# Patient Record
Sex: Female | Born: 1975 | Race: White | Hispanic: No | Marital: Married | Smoking: Former smoker
Health system: Southern US, Community
[De-identification: ages and names within clinical notes are randomized; demographics above are authoritative.]

## PROBLEM LIST (undated history)

## (undated) DIAGNOSIS — R51 Headache: Secondary | ICD-10-CM

## (undated) DIAGNOSIS — M069 Rheumatoid arthritis, unspecified: Secondary | ICD-10-CM

## (undated) DIAGNOSIS — R519 Headache, unspecified: Secondary | ICD-10-CM

## (undated) DIAGNOSIS — F419 Anxiety disorder, unspecified: Secondary | ICD-10-CM

## (undated) DIAGNOSIS — K219 Gastro-esophageal reflux disease without esophagitis: Secondary | ICD-10-CM

## (undated) DIAGNOSIS — I499 Cardiac arrhythmia, unspecified: Secondary | ICD-10-CM

## (undated) DIAGNOSIS — J302 Other seasonal allergic rhinitis: Secondary | ICD-10-CM

## (undated) DIAGNOSIS — R011 Cardiac murmur, unspecified: Secondary | ICD-10-CM

---
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
## 2006-01-10 HISTORY — PX: TUBAL LIGATION: SHX77

## 2008-01-11 HISTORY — PX: LEEP: SHX91

## 2012-10-16 ENCOUNTER — Ambulatory Visit: Payer: Self-pay | Admitting: Family Medicine

## 2012-10-19 DIAGNOSIS — E010 Iodine-deficiency related diffuse (endemic) goiter: Secondary | ICD-10-CM | POA: Insufficient documentation

## 2013-01-29 ENCOUNTER — Ambulatory Visit: Payer: Self-pay | Admitting: Emergency Medicine

## 2014-03-19 ENCOUNTER — Ambulatory Visit: Payer: Self-pay | Admitting: Internal Medicine

## 2014-03-19 LAB — HM MAMMOGRAPHY

## 2014-03-24 ENCOUNTER — Ambulatory Visit: Payer: Self-pay | Admitting: Internal Medicine

## 2014-04-17 LAB — LIPID PANEL
Cholesterol: 208 mg/dL — AB (ref 0–200)
HDL: 55 mg/dL (ref 35–70)
LDL Cholesterol: 137 mg/dL
Triglycerides: 79 mg/dL (ref 40–160)

## 2014-04-17 LAB — HM PAP SMEAR: HM PAP: NEGATIVE

## 2014-04-17 LAB — TSH: TSH: 1.2 u[IU]/mL (ref <=5.90)

## 2014-05-19 ENCOUNTER — Encounter: Payer: Self-pay | Admitting: *Deleted

## 2014-05-20 NOTE — Discharge Instructions (Signed)

## 2014-05-22 ENCOUNTER — Ambulatory Visit: Payer: 59 | Admitting: Student in an Organized Health Care Education/Training Program

## 2014-05-22 ENCOUNTER — Other Ambulatory Visit: Payer: Self-pay | Admitting: Gastroenterology

## 2014-05-22 ENCOUNTER — Ambulatory Visit
Admission: RE | Admit: 2014-05-22 | Discharge: 2014-05-22 | Disposition: A | Discharge status: Home / Self Care | Payer: 59 | Source: Ambulatory Visit | Attending: Gastroenterology | Admitting: Gastroenterology

## 2014-05-22 ENCOUNTER — Encounter
Admission: RE | Disposition: A | Discharge status: Home / Self Care | Payer: Self-pay | Source: Ambulatory Visit | Attending: Gastroenterology

## 2014-05-22 DIAGNOSIS — K219 Gastro-esophageal reflux disease without esophagitis: Secondary | ICD-10-CM | POA: Insufficient documentation

## 2014-05-22 DIAGNOSIS — Z9889 Other specified postprocedural states: Secondary | ICD-10-CM | POA: Insufficient documentation

## 2014-05-22 DIAGNOSIS — K5900 Constipation, unspecified: Secondary | ICD-10-CM | POA: Insufficient documentation

## 2014-05-22 DIAGNOSIS — K2950 Unspecified chronic gastritis without bleeding: Secondary | ICD-10-CM | POA: Diagnosis not present

## 2014-05-22 DIAGNOSIS — D124 Benign neoplasm of descending colon: Secondary | ICD-10-CM | POA: Insufficient documentation

## 2014-05-22 DIAGNOSIS — Z87891 Personal history of nicotine dependence: Secondary | ICD-10-CM | POA: Insufficient documentation

## 2014-05-22 DIAGNOSIS — R1310 Dysphagia, unspecified: Secondary | ICD-10-CM | POA: Diagnosis present

## 2014-05-22 DIAGNOSIS — J302 Other seasonal allergic rhinitis: Secondary | ICD-10-CM | POA: Diagnosis not present

## 2014-05-22 DIAGNOSIS — Z791 Long term (current) use of non-steroidal anti-inflammatories (NSAID): Secondary | ICD-10-CM | POA: Insufficient documentation

## 2014-05-22 DIAGNOSIS — K5909 Other constipation: Secondary | ICD-10-CM | POA: Diagnosis present

## 2014-05-22 DIAGNOSIS — Z7951 Long term (current) use of inhaled steroids: Secondary | ICD-10-CM | POA: Diagnosis not present

## 2014-05-22 DIAGNOSIS — K640 First degree hemorrhoids: Secondary | ICD-10-CM | POA: Diagnosis not present

## 2014-05-22 DIAGNOSIS — Z7982 Long term (current) use of aspirin: Secondary | ICD-10-CM | POA: Diagnosis not present

## 2014-05-22 DIAGNOSIS — D125 Benign neoplasm of sigmoid colon: Secondary | ICD-10-CM | POA: Insufficient documentation

## 2014-05-22 DIAGNOSIS — F419 Anxiety disorder, unspecified: Secondary | ICD-10-CM | POA: Diagnosis not present

## 2014-05-22 DIAGNOSIS — K921 Melena: Secondary | ICD-10-CM | POA: Diagnosis present

## 2014-05-22 HISTORY — PX: COLONOSCOPY: SHX5424

## 2014-05-22 HISTORY — DX: Gastro-esophageal reflux disease without esophagitis: K21.9

## 2014-05-22 HISTORY — PX: ESOPHAGOGASTRODUODENOSCOPY: SHX1529

## 2014-05-22 HISTORY — DX: Cardiac arrhythmia, unspecified: I49.9

## 2014-05-22 HISTORY — DX: Cardiac murmur, unspecified: R01.1

## 2014-05-22 HISTORY — DX: Anxiety disorder, unspecified: F41.9

## 2014-05-22 HISTORY — DX: Headache, unspecified: R51.9

## 2014-05-22 HISTORY — DX: Headache: R51

## 2014-05-22 HISTORY — PX: ESOPHAGOGASTRODUODENOSCOPY: SHX5428

## 2014-05-22 HISTORY — DX: Other seasonal allergic rhinitis: J30.2

## 2014-05-22 SURGERY — COLONOSCOPY
Anesthesia: Monitor Anesthesia Care | Wound class: Contaminated

## 2014-05-22 MED ORDER — LACTATED RINGERS IV SOLN
INTRAVENOUS | Status: DC
  Administered 2014-05-22 (×2): via INTRAVENOUS

## 2014-05-22 MED ORDER — SODIUM CHLORIDE 0.9 % IV SOLN: INTRAVENOUS | Status: DC

## 2014-05-22 MED ORDER — SIMETHICONE 40 MG/0.6ML PO SUSP
ORAL | Status: DC | PRN
  Administered 2014-05-22: 09:00:00

## 2014-05-22 MED ORDER — PROPOFOL 10 MG/ML IV BOLUS
INTRAVENOUS | Status: DC | PRN
  Administered 2014-05-22: 20 mg via INTRAVENOUS
  Administered 2014-05-22: 30 mg via INTRAVENOUS
  Administered 2014-05-22 (×2): 20 mg via INTRAVENOUS
  Administered 2014-05-22 (×3): 30 mg via INTRAVENOUS
  Administered 2014-05-22 (×3): 20 mg via INTRAVENOUS

## 2014-05-22 MED ORDER — LIDOCAINE HCL (CARDIAC) 20 MG/ML IV SOLN
INTRAVENOUS | Status: DC | PRN
  Administered 2014-05-22: 30 mg via INTRAVENOUS

## 2014-05-22 SURGICAL SUPPLY — 38 items
BALLN DILATOR 10-12 8 (BALLOONS)
BALLN DILATOR 12-15 8 (BALLOONS)
BALLN DILATOR 15-18 8 (BALLOONS)
BALLN DILATOR CRE 0-12 8 (BALLOONS)
BALLN DILATOR ESOPH 8 10 CRE (MISCELLANEOUS) IMPLANT
BALLOON DILATOR 12-15 8 (BALLOONS) IMPLANT
BALLOON DILATOR 15-18 8 (BALLOONS) IMPLANT
BALLOON DILATOR CRE 0-12 8 (BALLOONS) IMPLANT
BLOCK BITE 60FR ADLT L/F GRN (MISCELLANEOUS) ×2 IMPLANT
CANISTER SUCT 1200ML W/VALVE (MISCELLANEOUS) ×2 IMPLANT
FCP ESCP3.2XJMB 240X2.8X (MISCELLANEOUS)
FORCEPS BIOP RAD 4 LRG CAP 4 (CUTTING FORCEPS) ×2 IMPLANT
FORCEPS BIOP RJ4 240 W/NDL (MISCELLANEOUS)
FORCEPS ESCP3.2XJMB 240X2.8X (MISCELLANEOUS) IMPLANT
GOWN CVR UNV OPN BCK APRN NK (MISCELLANEOUS) ×2 IMPLANT
GOWN ISOL THUMB LOOP REG UNIV (MISCELLANEOUS) ×2
HEMOCLIP INSTINCT (CLIP) IMPLANT
INJECTOR VARIJECT VIN23 (MISCELLANEOUS) IMPLANT
KIT CO2 TUBING (TUBING) ×2 IMPLANT
KIT DEFENDO VALVE AND CONN (KITS) IMPLANT
KIT ENDO PROCEDURE OLY (KITS) ×2 IMPLANT
LIGATOR MULTIBAND 6SHOOTER MBL (MISCELLANEOUS) IMPLANT
MARKER SPOT ENDO TATTOO 5ML (MISCELLANEOUS) IMPLANT
PAD GROUND ADULT SPLIT (MISCELLANEOUS) IMPLANT
SNARE SHORT THROW 13M SML OVAL (MISCELLANEOUS) ×2 IMPLANT
SNARE SHORT THROW 30M LRG OVAL (MISCELLANEOUS) IMPLANT
SPOT EX ENDOSCOPIC TATTOO (MISCELLANEOUS)
SUCTION POLY TRAP 4CHAMBER (MISCELLANEOUS) IMPLANT
SYR INFLATION 60ML (SYRINGE) IMPLANT
TRAP SUCTION POLY (MISCELLANEOUS) ×2 IMPLANT
TUBING CONN 6MMX3.1M (TUBING)
TUBING SUCTION CONN 0.25 STRL (TUBING) IMPLANT
UNDERPAD 30X60 958B10 (PK) (MISCELLANEOUS) IMPLANT
VALVE BIOPSY ENDO (VALVE) IMPLANT
VARIJECT INJECTOR VIN23 (MISCELLANEOUS)
WATER AUXILLARY (MISCELLANEOUS) IMPLANT
WATER STERILE IRR 500ML POUR (IV SOLUTION) ×2 IMPLANT
WIRE CRE 18-20MM 8CM F G (MISCELLANEOUS) IMPLANT

## 2014-05-22 NOTE — Anesthesia Postprocedure Evaluation (Signed)
  Anesthesia Post-op Note  Patient: Debra Dixon  Procedure(s) Performed: Procedure(s) with comments: COLONOSCOPY (N/A) - cecum time- 0925  ESOPHAGOGASTRODUODENOSCOPY (EGD) (N/A)  Anesthesia type:MAC  Patient location: PACU  Post pain: Pain level controlled  Post assessment: Post-op Vital signs reviewed, Patient's Cardiovascular Status Stable, Respiratory Function Stable, Patent Airway and No signs of Nausea or vomiting  Post vital signs: Reviewed and stable  Last Vitals:  Filed Vitals:   05/22/14 0945  BP: 100/42  Pulse: 65  Temp:   Resp:     Level of consciousness: awake, alert  and patient cooperative  Complications: No apparent anesthesia complications

## 2014-05-22 NOTE — Anesthesia Preprocedure Evaluation (Signed)
Anesthesia Evaluation  Patient identified by MRN, date of birth, ID band  Reviewed: Allergy & Precautions, H&P , NPO status , Patient's Chart, lab work & pertinent test results  History of Anesthesia Complications Negative for: history of anesthetic complications  Airway Mallampati: II  TM Distance: >3 FB Neck ROM: full    Dental no notable dental hx. (+)    Pulmonary former smoker (3 weeks ago),    Pulmonary exam normal       Cardiovascular Exercise Tolerance: Good + dysrhythmias (palpitation) Rhythm:regular Rate:Normal     Neuro/Psych  Headaches,    GI/Hepatic GERD-  Controlled,  Endo/Other    Renal/GU      Musculoskeletal   Abdominal   Peds  Hematology   Anesthesia Other Findings   Reproductive/Obstetrics negative OB ROS                             Anesthesia Physical Anesthesia Plan  ASA: II  Anesthesia Plan: MAC   Post-op Pain Management:    Induction:   Airway Management Planned:   Additional Equipment:   Intra-op Plan:   Post-operative Plan:   Informed Consent: I have reviewed the patients History and Physical, chart, labs and discussed the procedure including the risks, benefits and alternatives for the proposed anesthesia with the patient or authorized representative who has indicated his/her understanding and acceptance.     Plan Discussed with: CRNA  Anesthesia Plan Comments:         Anesthesia Quick Evaluation

## 2014-05-22 NOTE — Op Note (Signed)
[facility] Gastroenterology Patient Name: Debra Dixon Procedure Date: 05/22/2014 8:56 AM MRN: [id number] Account #: [id number] Date of Birth: 03-30-1975 Admit Type: Outpatient Age: 39 Room: [facility] OR ROOM 01 Gender: Female Note Status: Finalized Procedure:         Upper GI endoscopy Indications:       Dysphagia Providers:         Lucilla Lame, MD Referring MD:      Halina Maidens, MD (Referring MD) Medicines:         Propofol per Anesthesia Complications:     No immediate complications. Procedure:         Pre-Anesthesia Assessment:                    - Prior to the procedure, a History and Physical was                     performed, and patient medications and allergies were                     reviewed. The patient's tolerance of previous anesthesia                     was also reviewed. The risks and benefits of the procedure                     and the sedation options and risks were discussed with the                     patient. All questions were answered, and informed consent                     was obtained. Prior Anticoagulants: The patient has taken                     no previous anticoagulant or antiplatelet agents. ASA                     Grade Assessment: II - A patient with mild systemic                     disease. After reviewing the risks and benefits, the                     patient was deemed in satisfactory condition to undergo                     the procedure.                    After obtaining informed consent, the endoscope was passed                     under direct vision. Throughout the procedure, the                     patient's blood pressure, pulse, and oxygen saturations                     were monitored continuously. The Olympus GIF H180J                     colonscope (Z#:[id number]) was introduced through the mouth,  and advanced to the second part of duodenum. The upper GI                     endoscopy was  accomplished without difficulty. The patient                     tolerated the procedure well. Findings:      The examined esophagus was normal. Two random biopsies were obtained in       the middle third of the esophagus with cold forceps for histology.      Localized mild inflammation characterized by erythema was found in the       gastric antrum. Biopsies were taken with a cold forceps for histology.      The examined duodenum was normal. Impression:        - Normal esophagus.                    - Gastritis. Biopsied.                    - Normal examined duodenum.                    - Two random biopsies were obtained in the middle third of                     the esophagus. Recommendation:    - Await pathology results.                    - Perform a colonoscopy today. Procedure Code(s): --- Professional ---                    [phone], Esophagogastroduodenoscopy, flexible, transoral;                     with biopsy, single or multiple Diagnosis Code(s): --- Professional ---                    R13.10, Dysphagia, unspecified                    K29.70, Gastritis, unspecified, without bleeding CPT copyright 2014 American Medical Association. All rights reserved. The codes documented in this report are preliminary and upon coder review may  be revised to meet current compliance requirements. Lucilla Lame, MD 05/22/2014 9:20:18 AM This report has been signed electronically. Number of Addenda: 0 Note Initiated On: 05/22/2014 8:56 AM Total Procedure Duration: 0 hours 2 minutes 2 seconds       [facility]

## 2014-05-22 NOTE — Transfer of Care (Signed)
Immediate Anesthesia Transfer of Care Note  Patient: Debra Dixon  Procedure(s) Performed: Procedure(s) with comments: COLONOSCOPY (N/A) - cecum time- 0925  ESOPHAGOGASTRODUODENOSCOPY (EGD) (N/A)  Patient Location: PACU  Anesthesia Type: MAC  Level of Consciousness: awake, alert  and patient cooperative  Airway and Oxygen Therapy: Patient Spontanous Breathing and Patient connected to supplemental oxygen  Post-op Assessment: Post-op Vital signs reviewed, Patient's Cardiovascular Status Stable, Respiratory Function Stable, Patent Airway and No signs of Nausea or vomiting  Post-op Vital Signs: Reviewed and stable  Complications: No apparent anesthesia complications

## 2014-05-22 NOTE — Op Note (Signed)
[facility] Gastroenterology Patient Name: Debra Dixon Procedure Date: 05/22/2014 8:57 AM MRN: [id number] Account #: [id number] Date of Birth: [birth year] Admit Type: Outpatient Age: 39 Room: [facility] OR ROOM 01 Gender: Female Note Status: Finalized Procedure:         Colonoscopy Indications:       Hematochezia, Constipation Providers:         Lucilla Lame, MD Referring MD:      Halina Maidens, MD (Referring MD) Medicines:         Propofol per Anesthesia Complications:     No immediate complications. Procedure:         Pre-Anesthesia Assessment:                    - Prior to the procedure, a History and Physical was                     performed, and patient medications and allergies were                     reviewed. The patient's tolerance of previous anesthesia                     was also reviewed. The risks and benefits of the procedure                     and the sedation options and risks were discussed with the                     patient. All questions were answered, and informed consent                     was obtained. Prior Anticoagulants: The patient has taken                     no previous anticoagulant or antiplatelet agents. ASA                     Grade Assessment: II - A patient with mild systemic                     disease. After reviewing the risks and benefits, the                     patient was deemed in satisfactory condition to undergo                     the procedure.                    After obtaining informed consent, the colonoscope was                     passed under direct vision. Throughout the procedure, the                     patient's blood pressure, pulse, and oxygen saturations                     were monitored continuously. The Olympus CF H180AL                     colonoscope (S#: [id number]) was introduced through the anus  and advanced to the the cecum, identified by appendiceal                     orifice  and ileocecal valve. The colonoscopy was performed                     without difficulty. The patient tolerated the procedure                     well. The quality of the bowel preparation was excellent. Findings:      The perianal and digital rectal examinations were normal.      A 5 mm polyp was found in the descending colon. The polyp was sessile.       The polyp was removed with a cold snare. Resection and retrieval were       complete.      A 5 mm polyp was found in the sigmoid colon. The polyp was sessile. The       polyp was removed with a cold biopsy forceps. Resection and retrieval       were complete.      A 7 mm polyp was found in the sigmoid colon. The polyp was sessile. The       polyp was removed with a cold snare. Resection and retrieval were       complete.      Non-bleeding internal hemorrhoids were found during retroflexion. The       hemorrhoids were Grade I (internal hemorrhoids that do not prolapse). Impression:        - One 5 mm polyp in the descending colon. Resected and                     retrieved.                    - One 5 mm polyp in the sigmoid colon. Resected and                     retrieved.                    - One 7 mm polyp in the sigmoid colon. Resected and                     retrieved.                    - Non-bleeding internal hemorrhoids. Recommendation:    - Await pathology results.                    - Repeat colonoscopy in 5 years if polyp adenoma and 10                     years if hyperplastic Procedure Code(s): --- Professional ---                    [phone], Colonoscopy, flexible; with removal of tumor(s),                     polyp(s), or other lesion(s) by snare technique                    07622, 58, Colonoscopy, flexible; with biopsy, single or  multiple Diagnosis Code(s): --- Professional ---                    K92.1, Melena                    K59.00, Constipation, unspecified                    D12.4, Benign  neoplasm of descending colon                    D12.5, Benign neoplasm of sigmoid colon CPT copyright 2014 American Medical Association. All rights reserved. The codes documented in this report are preliminary and upon coder review may  be revised to meet current compliance requirements. Lucilla Lame, MD 05/22/2014 9:39:25 AM This report has been signed electronically. Number of Addenda: 0 Note Initiated On: 05/22/2014 8:57 AM Scope Withdrawal Time: 0 hours 8 minutes 37 seconds  Total Procedure Duration: 0 hours 11 minutes 39 seconds       [facility]

## 2014-05-22 NOTE — Anesthesia Procedure Notes (Signed)
Procedure Name: MAC Performed by: Farooq Petrovich Pre-anesthesia Checklist: Patient identified, Emergency Drugs available, Suction available, Timeout performed and Patient being monitored Patient Re-evaluated:Patient Re-evaluated prior to inductionOxygen Delivery Method: Nasal cannula Placement Confirmation: positive ETCO2     

## 2014-05-22 NOTE — H&P (Signed)
  [facility]  [address] [place],  [address] Phone: [phone] Fax : [phone]  Primary Care Physician:  Halina Maidens, MD Primary Gastroenterologist:  Dr. Allen Norris  Pre-Procedure History & Physical: HPI:  Debra Dixon is a 39 y.o. female is here for an endoscopy and colonoscopy.   Past Medical History  Diagnosis Date  . Heart murmur     as child  . Headache     caused by cigarette allergy  . Dysrhythmia     Palpatations - echo done 5 yrs ago in [place], no issues.  Marland Kitchen GERD (gastroesophageal reflux disease)   . Anxiety     no meds  . Seasonal allergies     Past Surgical History  Procedure Laterality Date  . Tubal ligation  2008  . Knee arthroscopy Left 1988  . Appendectomy  1985  . Leep  2010    Prior to Admission medications   Medication Sig Start Date End Date Taking? Authorizing Provider  aspirin-sod bicarb-citric acid (ALKA-SELTZER) 325 MG TBEF tablet Take 325 mg by mouth as needed.   Yes Historical Provider, MD  fluticasone (FLONASE) 50 MCG/ACT nasal spray Place 1 spray into both nostrils daily. AM   Yes Historical Provider, MD  ibuprofen (ADVIL,MOTRIN) 100 MG chewable tablet Chew by mouth as needed.   Yes Historical Provider, MD    Allergies as of 05/16/2014  . (Not on File)    History reviewed. No pertinent family history.  History   Social History  . Marital Status: Married    Spouse Name: N/A  . Number of Children: N/A  . Years of Education: N/A   Occupational History  . Not on file.   Social History Main Topics  . Smoking status: Former Smoker -- 2.00 packs/day    Quit date: 05/05/2014  . Smokeless tobacco: Not on file  . Alcohol Use: No     Comment: 1- 2 drinks per year  . Drug Use: Not on file  . Sexual Activity: Not on file   Other Topics Concern  . Not on file   Social History Narrative  . No narrative on file    Review of Systems: See HPI, otherwise negative ROS  Physical Exam: BP 109/66 mmHg   Pulse 62  Temp(Src) 98.4 F (36.9 C) (Temporal)  Resp 16  Ht 5\' 4"  (1.626 m)  Wt 153 lb (69.4 kg)  BMI 26.25 kg/m2  SpO2 100%  LMP 04/28/2014 (Exact Date) General:   Alert,  pleasant and cooperative in NAD Head:  Normocephalic and atraumatic. Neck:  Supple; no masses or thyromegaly. Lungs:  Clear throughout to auscultation.    Heart:  Regular rate and rhythm. Abdomen:  Soft, nontender and nondistended. Normal bowel sounds, without guarding, and without rebound.   Neurologic:  Alert and  oriented x4;  grossly normal neurologically.  Impression/Plan: Debra Dixon is here for an endoscopy and colonoscopy to be performed for Dysphagia, constipation, hematochezia. Risks, benefits, limitations, and alternatives regarding  endoscopy and colonoscopy have been reviewed with the patient.  Questions have been answered.  All parties agreeable.   [facility], MD  05/22/2014, 8:44 AM

## 2014-05-23 ENCOUNTER — Encounter: Payer: Self-pay | Admitting: Gastroenterology

## 2014-05-25 ENCOUNTER — Encounter: Payer: Self-pay | Admitting: Internal Medicine

## 2014-05-25 DIAGNOSIS — K581 Irritable bowel syndrome with constipation: Secondary | ICD-10-CM | POA: Insufficient documentation

## 2014-05-25 DIAGNOSIS — F17201 Nicotine dependence, unspecified, in remission: Secondary | ICD-10-CM | POA: Insufficient documentation

## 2014-05-25 DIAGNOSIS — Z8742 Personal history of other diseases of the female genital tract: Secondary | ICD-10-CM | POA: Insufficient documentation

## 2014-05-25 DIAGNOSIS — F419 Anxiety disorder, unspecified: Secondary | ICD-10-CM | POA: Insufficient documentation

## 2014-05-25 DIAGNOSIS — R0981 Nasal congestion: Secondary | ICD-10-CM | POA: Insufficient documentation

## 2014-05-25 DIAGNOSIS — T7840XA Allergy, unspecified, initial encounter: Secondary | ICD-10-CM | POA: Insufficient documentation

## 2015-09-16 ENCOUNTER — Ambulatory Visit (INDEPENDENT_AMBULATORY_CARE_PROVIDER_SITE_OTHER): Payer: BLUE CROSS/BLUE SHIELD | Admitting: Internal Medicine

## 2015-09-16 ENCOUNTER — Encounter: Payer: Self-pay | Admitting: Internal Medicine

## 2015-09-16 VITALS — BP 122/82 | HR 103 | Resp 16 | Ht 64.0 in | Wt 153.0 lb

## 2015-09-16 DIAGNOSIS — M05741 Rheumatoid arthritis with rheumatoid factor of right hand without organ or systems involvement: Secondary | ICD-10-CM | POA: Insufficient documentation

## 2015-09-16 DIAGNOSIS — F410 Panic disorder [episodic paroxysmal anxiety] without agoraphobia: Secondary | ICD-10-CM | POA: Diagnosis not present

## 2015-09-16 DIAGNOSIS — M05742 Rheumatoid arthritis with rheumatoid factor of left hand without organ or systems involvement: Secondary | ICD-10-CM

## 2015-09-16 DIAGNOSIS — M1990 Unspecified osteoarthritis, unspecified site: Secondary | ICD-10-CM

## 2015-09-16 MED ORDER — METHYLPREDNISOLONE 4 MG PO TBPK: ORAL_TABLET | ORAL | 0 refills | Status: DC

## 2015-09-16 MED ORDER — LORAZEPAM 0.5 MG PO TABS: 0.5000 mg | ORAL_TABLET | Freq: Two times a day (BID) | ORAL | 0 refills | Status: DC | PRN
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
## 2015-09-17 ENCOUNTER — Other Ambulatory Visit: Payer: Self-pay | Admitting: Internal Medicine

## 2015-09-17 DIAGNOSIS — M1990 Unspecified osteoarthritis, unspecified site: Secondary | ICD-10-CM

## 2015-09-17 LAB — CBC WITH DIFFERENTIAL/PLATELET
BASOS: 1 %
Basophils Absolute: 0.1 10*3/uL (ref 0.0–0.2)
EOS (ABSOLUTE): 0.2 10*3/uL (ref 0.0–0.4)
EOS: 1 %
HEMATOCRIT: 41.2 % (ref 34.0–46.6)
HEMOGLOBIN: 13.6 g/dL (ref 11.1–15.9)
Immature Grans (Abs): 0 10*3/uL (ref 0.0–0.1)
Immature Granulocytes: 0 %
LYMPHS ABS: 1.5 10*3/uL (ref 0.7–3.1)
Lymphs: 14 %
MCH: 30.1 pg (ref 26.6–33.0)
MCHC: 33 g/dL (ref 31.5–35.7)
MCV: 91 fL (ref 79–97)
MONOCYTES: 9 %
Monocytes Absolute: 1 10*3/uL — ABNORMAL HIGH (ref 0.1–0.9)
NEUTROS ABS: 8.4 10*3/uL — AB (ref 1.4–7.0)
Neutrophils: 75 %
Platelets: 400 10*3/uL — ABNORMAL HIGH (ref 150–379)
RBC: 4.52 x10E6/uL (ref 3.77–5.28)
RDW: 13.6 % (ref 12.3–15.4)
WBC: 11.2 10*3/uL — AB (ref 3.4–10.8)

## 2015-09-17 LAB — RHEUMATOID FACTOR: RHEUMATOID FACTOR: 66.3 [IU]/mL — AB (ref 0.0–13.9)

## 2015-09-17 LAB — B. BURGDORFI ANTIBODIES: Lyme IgG/IgM Ab: <0.91 {ISR} (ref 0.00–0.90)

## 2015-09-17 LAB — ANA W/REFLEX IF POSITIVE: Anti Nuclear Antibody(ANA): NEGATIVE

## 2015-10-09 DIAGNOSIS — M7989 Other specified soft tissue disorders: Secondary | ICD-10-CM | POA: Diagnosis not present

## 2015-10-09 DIAGNOSIS — M79641 Pain in right hand: Secondary | ICD-10-CM | POA: Diagnosis not present

## 2015-10-09 DIAGNOSIS — M79642 Pain in left hand: Secondary | ICD-10-CM | POA: Diagnosis not present

## 2015-10-09 DIAGNOSIS — R768 Other specified abnormal immunological findings in serum: Secondary | ICD-10-CM | POA: Diagnosis not present

## 2016-01-15 DIAGNOSIS — R768 Other specified abnormal immunological findings in serum: Secondary | ICD-10-CM | POA: Diagnosis not present

## 2016-01-15 DIAGNOSIS — M79642 Pain in left hand: Secondary | ICD-10-CM | POA: Diagnosis not present

## 2016-01-15 DIAGNOSIS — M79641 Pain in right hand: Secondary | ICD-10-CM | POA: Diagnosis not present

## 2016-02-04 DIAGNOSIS — R768 Other specified abnormal immunological findings in serum: Secondary | ICD-10-CM | POA: Diagnosis not present

## 2016-02-04 DIAGNOSIS — M25531 Pain in right wrist: Secondary | ICD-10-CM | POA: Diagnosis not present

## 2016-04-26 ENCOUNTER — Encounter: Payer: Self-pay | Admitting: Internal Medicine

## 2016-04-26 ENCOUNTER — Ambulatory Visit (INDEPENDENT_AMBULATORY_CARE_PROVIDER_SITE_OTHER): Payer: BLUE CROSS/BLUE SHIELD | Admitting: Internal Medicine

## 2016-04-26 ENCOUNTER — Other Ambulatory Visit: Payer: Self-pay | Admitting: Internal Medicine

## 2016-04-26 VITALS — BP 114/70 | HR 88 | Temp 97.8°F | Ht 64.0 in | Wt 154.0 lb

## 2016-04-26 DIAGNOSIS — J0100 Acute maxillary sinusitis, unspecified: Secondary | ICD-10-CM | POA: Diagnosis not present

## 2016-04-26 MED ORDER — AMOXICILLIN-POT CLAVULANATE 875-125 MG PO TABS: 1.0000 | ORAL_TABLET | Freq: Two times a day (BID) | ORAL | 0 refills | Status: DC

## 2016-04-26 MED ORDER — GUAIFENESIN-CODEINE 100-10 MG/5ML PO SYRP: 5.0000 mL | ORAL_SOLUTION | Freq: Three times a day (TID) | ORAL | 0 refills | Status: DC | PRN

## 2016-04-26 NOTE — Patient Instructions (Signed)

## 2016-04-26 NOTE — Progress Notes (Signed)
Date:  04/26/2016   Name:  Debra Dixon   DOB:  22-Sep-1975   MRN:  [id number]   Chief Complaint: Sore Throat (X 3 days. Voice is going away. A lot of mucous- yellow. Headache, and sinus pressure. No fever noticed. ) Sinusitis  This is a new problem. The current episode started in the past 7 days. The problem has been gradually worsening since onset. There has been no fever. The pain is mild. Associated symptoms include congestion, coughing, a hoarse voice, sinus pressure and a sore throat. Pertinent negatives include no chills, ear pain, headaches or shortness of breath. Past treatments include oral decongestants. The treatment provided mild relief.      Review of Systems  Constitutional: Negative for chills, fatigue and fever.  HENT: Positive for congestion, hoarse voice, sinus pressure and sore throat. Negative for ear pain.   Eyes: Negative for visual disturbance.  Respiratory: Positive for cough. Negative for shortness of breath.   Cardiovascular: Negative for chest pain.  Gastrointestinal: Negative for diarrhea and vomiting.  Neurological: Negative for headaches.    Patient Active Problem List   Diagnosis Date Noted  . Inflammatory arthritis 09/16/2015  . Anxiety disorder 05/25/2014  . 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 Allergic state 05/25/2014  . H/O abnormal cervical Papanicolaou smear 05/25/2014  . Irritable bowel syndrome with constipation 05/25/2014  . Tobacco use disorder, moderate, in early remission 05/25/2014  . Congestion of nasal sinus 05/25/2014    Prior to Admission medications   Medication Sig Start Date End Date Taking? Authorizing Provider  aspirin-sod bicarb-citric acid (ALKA-SELTZER) 325 MG TBEF tablet Take 325 mg by mouth as needed.   Yes Historical Provider, MD  fluticasone (FLONASE) 50 MCG/ACT nasal spray Place 1 spray into both nostrils daily. AM   Yes Historical Provider, MD  ibuprofen (ADVIL,MOTRIN) 100 MG chewable tablet Chew by mouth as needed.    Yes Historical Provider, MD  LORazepam (ATIVAN) 0.5 MG tablet Take 1 tablet (0.5 mg total) by mouth 2 (two) times daily as needed for anxiety. 09/16/15  Yes Glean Hess, MD  nicotine polacrilex (NICORETTE) 2 MG gum Take 2 mg by mouth as needed.   Yes Historical Provider, MD    No Known Allergies  Past Surgical History:  Procedure Laterality Date  . APPENDECTOMY  1985  . COLONOSCOPY N/A 05/22/2014   Procedure: COLONOSCOPY;  Surgeon: Lucilla Lame, MD;  Location: [facility];  Service: Gastroenterology;  Laterality: N/A;  cecum time- 0925   . ESOPHAGOGASTRODUODENOSCOPY N/A 05/22/2014   Procedure: ESOPHAGOGASTRODUODENOSCOPY (EGD);  Surgeon: Lucilla Lame, MD;  Location: [place];  Service: Gastroenterology;  Laterality: N/A;  . ESOPHAGOGASTRODUODENOSCOPY  05/22/2014  . KNEE ARTHROSCOPY Left 1988  . LEEP  2010  . TUBAL LIGATION  2008    Social History  Substance Use Topics  . Smoking status: Former Smoker    Packs/day: 2.00    Years: 25.00    Quit date: 05/05/2014  . Smokeless tobacco: Never Used  . Alcohol use No     Comment: 1- 2 drinks per year     Medication list has been reviewed and updated.   Physical Exam  Constitutional: She is oriented to person, place, and time. She appears well-developed and well-nourished.  HENT:  Right Ear: External ear and ear canal normal. Tympanic membrane is not erythematous and not retracted.  Left Ear: External ear and ear canal normal. Tympanic membrane is not erythematous and not retracted.  Nose: Right sinus exhibits no  maxillary sinus tenderness and no frontal sinus tenderness. Left sinus exhibits no maxillary sinus tenderness and no frontal sinus tenderness.  Mouth/Throat: Uvula is midline and mucous membranes are normal. No oral lesions. Posterior oropharyngeal erythema present. No oropharyngeal exudate.  Cardiovascular: Normal rate, regular rhythm and normal heart sounds.   Pulmonary/Chest: Breath sounds normal. She has  no wheezes. She has no rales.  Lymphadenopathy:    She has no cervical adenopathy.  Neurological: She is alert and oriented to person, place, and time.    BP 114/70 (BP Location: Right Arm, Patient Position: Sitting, Cuff Size: Normal)   Pulse 88   Temp 97.8 F (36.6 C) (Oral)   Ht 5\' 4"  (1.626 m)   Wt 154 lb (69.9 kg)   LMP 04/24/2016 (Exact Date)   SpO2 100%   BMI 26.43 kg/m   Assessment and Plan: 1. Acute non-recurrent maxillary sinusitis Allegra 180 mg per day Continue over the counter cold and sinus medication Delsym if needed for mild cough   Meds ordered this encounter  Medications  . amoxicillin-clavulanate (AUGMENTIN) 875-125 MG tablet    Sig: Take 1 tablet by mouth 2 (two) times daily.    Dispense:  20 tablet    Refill:  0  . guaiFENesin-codeine (ROBITUSSIN AC) 100-10 MG/5ML syrup    Sig: Take 5 mLs by mouth 3 (three) times daily as needed for cough.    Dispense:  150 mL    Refill:  0    Halina Maidens, MD [facility]  04/26/2016

## 2016-04-28 ENCOUNTER — Encounter: Payer: Self-pay | Admitting: Internal Medicine

## 2016-04-28 ENCOUNTER — Other Ambulatory Visit: Payer: Self-pay | Admitting: Internal Medicine

## 2016-04-28 MED ORDER — AMOXICILLIN-POT CLAVULANATE 250-62.5 MG/5ML PO SUSR: 500.0000 mg | Freq: Three times a day (TID) | ORAL | 0 refills | Status: AC

## 2016-05-20 ENCOUNTER — Encounter: Payer: Self-pay | Admitting: Internal Medicine

## 2016-05-23 ENCOUNTER — Encounter: Payer: Self-pay | Admitting: Internal Medicine

## 2016-06-06 ENCOUNTER — Encounter: Payer: Self-pay | Admitting: Internal Medicine

## 2016-06-07 NOTE — Telephone Encounter (Signed)
Ongoing conversation with pt. She needs referral to [facility] for second opinion.

## 2016-06-14 ENCOUNTER — Other Ambulatory Visit: Payer: Self-pay | Admitting: Internal Medicine

## 2016-06-14 DIAGNOSIS — M1990 Unspecified osteoarthritis, unspecified site: Secondary | ICD-10-CM

## 2016-08-08 ENCOUNTER — Encounter: Payer: Self-pay | Admitting: Internal Medicine

## 2016-08-25 DIAGNOSIS — R52 Pain, unspecified: Secondary | ICD-10-CM | POA: Diagnosis not present

## 2016-08-25 DIAGNOSIS — Z87891 Personal history of nicotine dependence: Secondary | ICD-10-CM | POA: Diagnosis not present

## 2016-08-25 DIAGNOSIS — Z6826 Body mass index (BMI) 26.0-26.9, adult: Secondary | ICD-10-CM | POA: Diagnosis not present

## 2016-08-25 DIAGNOSIS — G8929 Other chronic pain: Secondary | ICD-10-CM | POA: Diagnosis not present

## 2016-08-25 DIAGNOSIS — M1230 Palindromic rheumatism, unspecified site: Secondary | ICD-10-CM | POA: Diagnosis not present

## 2016-08-25 DIAGNOSIS — R202 Paresthesia of skin: Secondary | ICD-10-CM | POA: Diagnosis not present

## 2016-09-02 IMAGING — MG MM ADDL VIEWS W/ TOMO
8 of 12 series · 8 of 28 positions shown · non-contrast
Comparison: Screening mammogram dated in 03/19/2014

CLINICAL DATA: Possible asymmetry in the medial right breast and in
the upper left breast on a recent baseline screening mammogram.

EXAM:
DIGITAL DIAGNOSTIC BILATERAL MAMMOGRAM WITH 3D TOMOSYNTHESIS WITH
CAD
ULTRASOUND BILATERAL BREAST

[L CC synth-2D (1 of 2)]
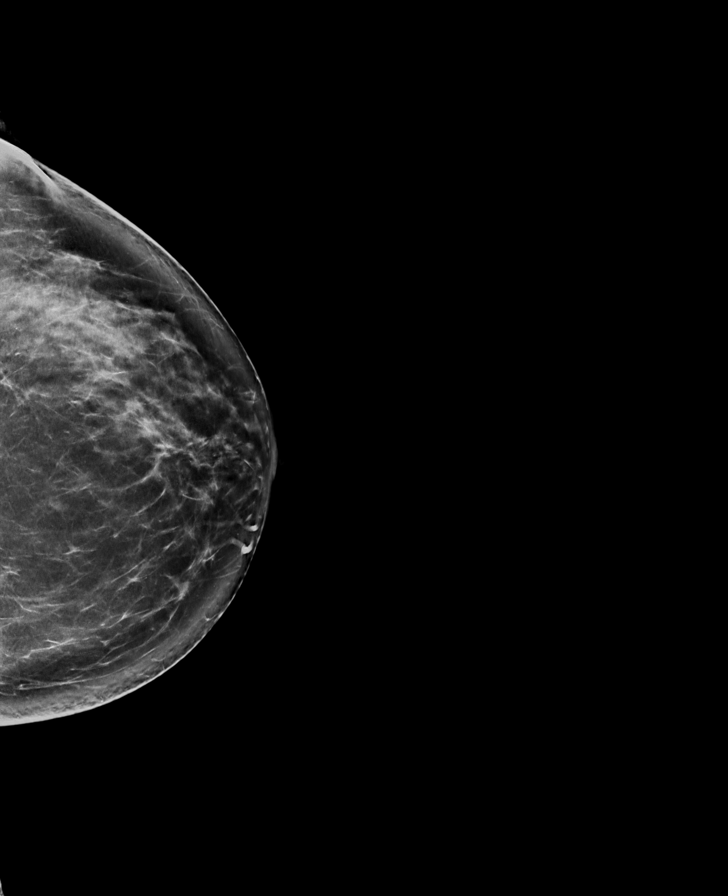

[R MLO]
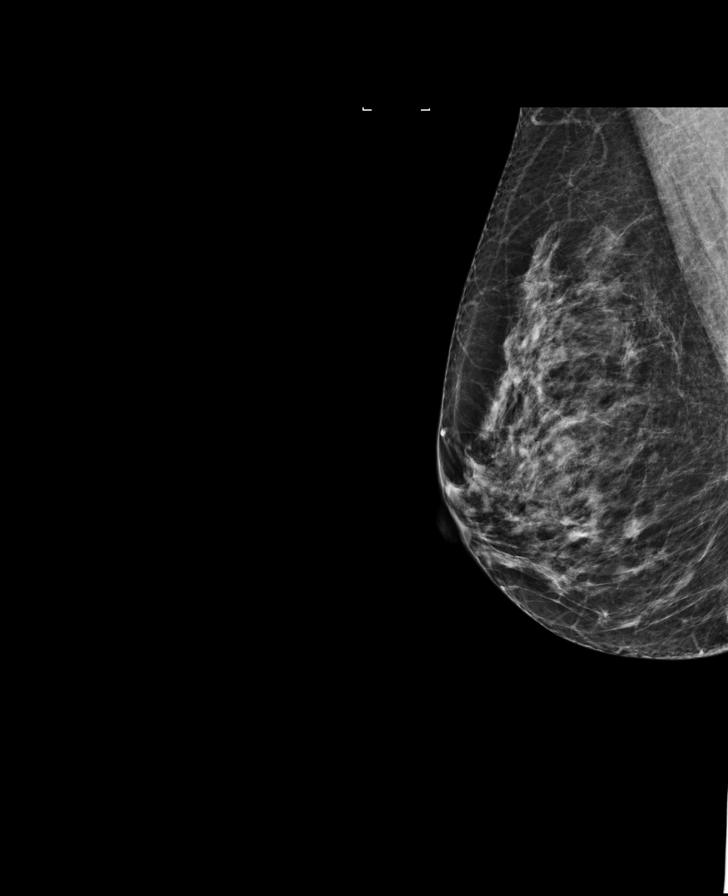

[R CC synth-2D]
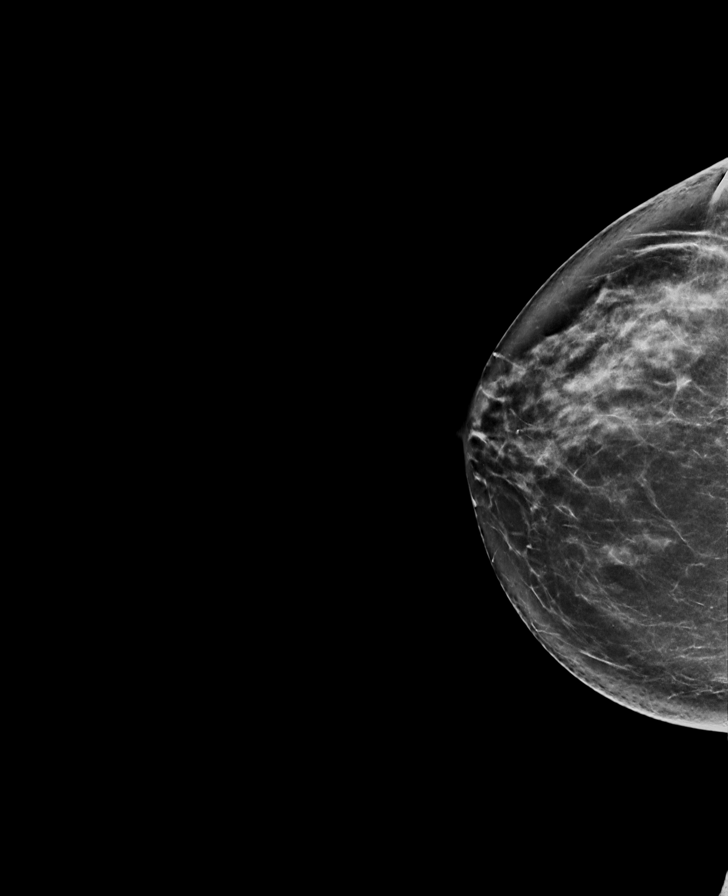

[R CC]
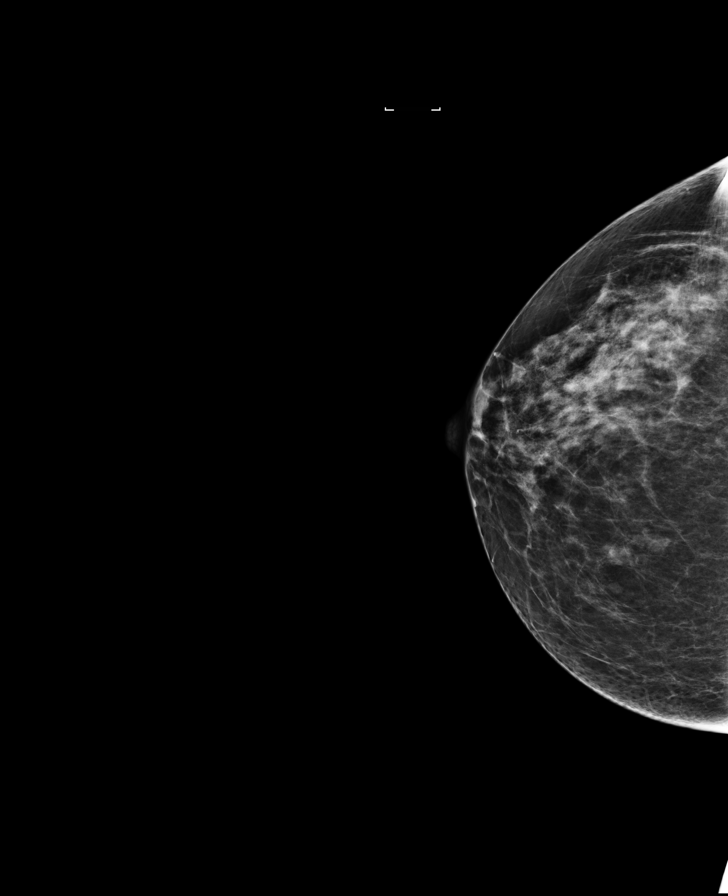

[R MLO synth-2D]
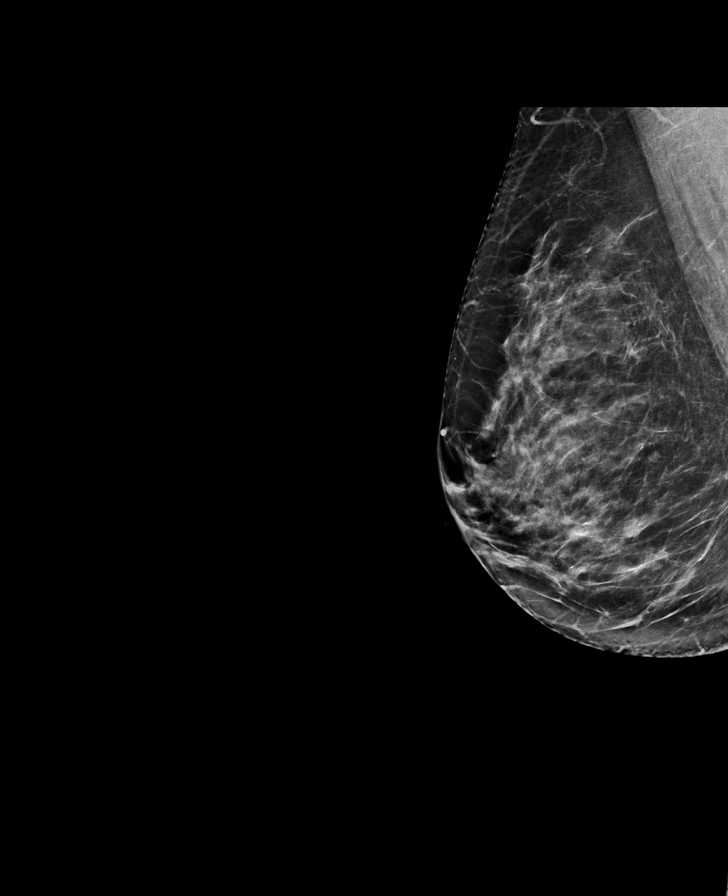

[L MLO]
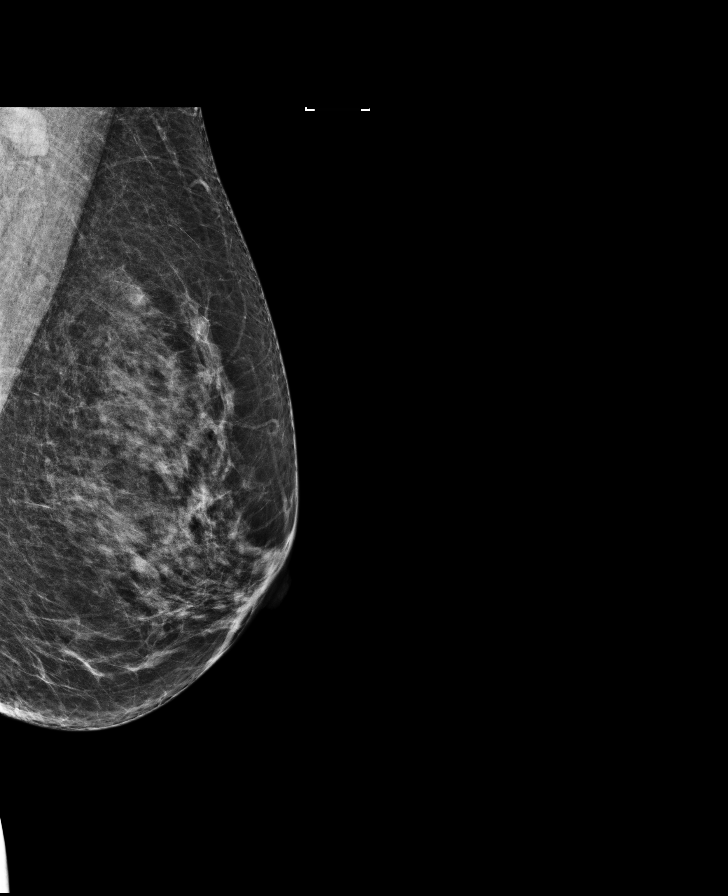

[L CC]
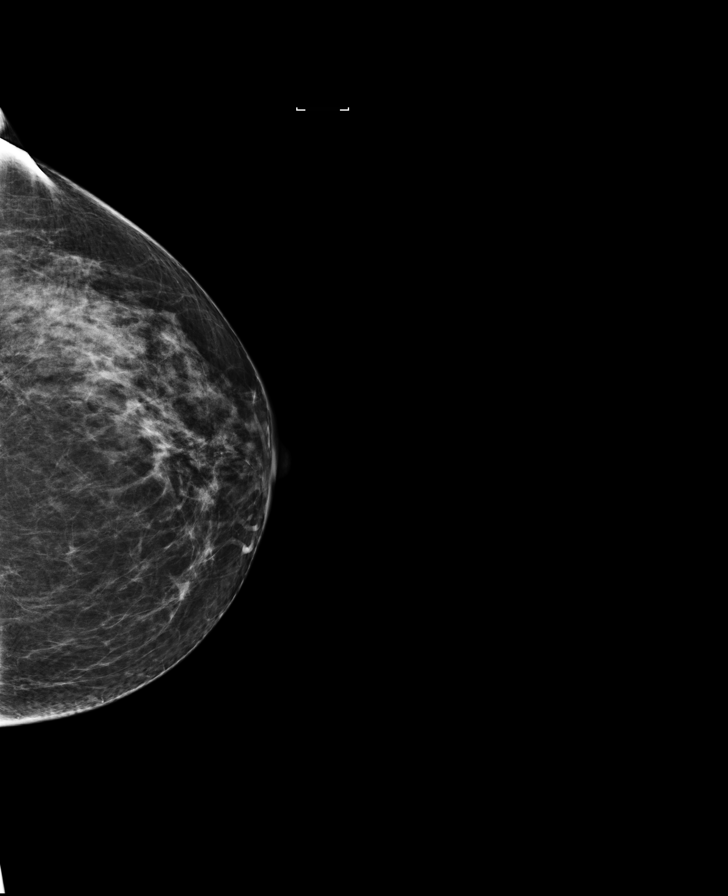

[L CC synth-2D (2 of 2)]
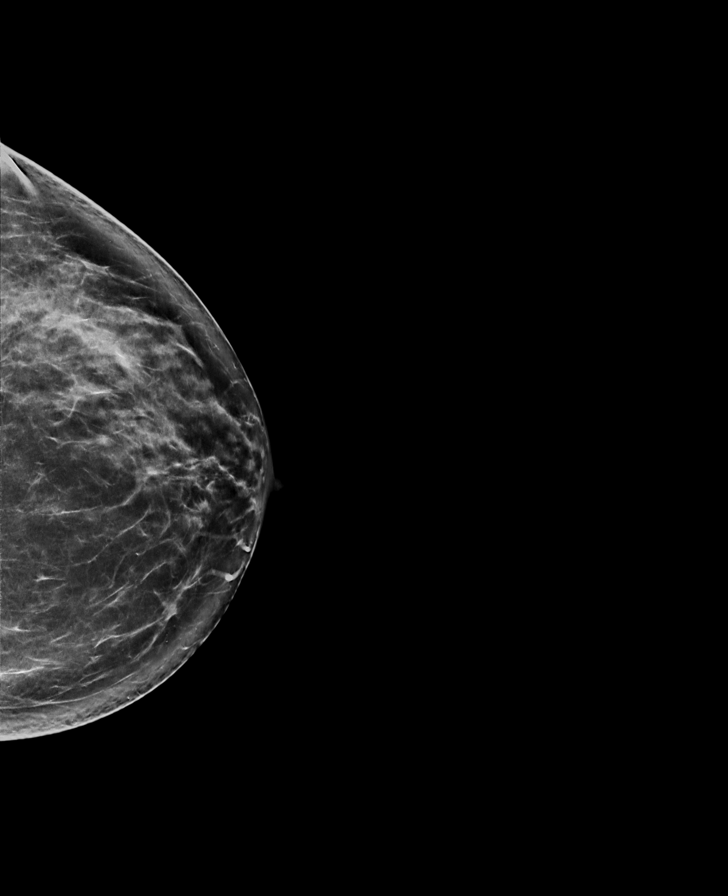

[8 of 28 positions shown; findings below may reference images not displayed]

ACR Breast Density Category c: The breast tissue is heterogeneously
dense, which may obscure small masses.
FINDINGS: 3D tomographic images of both breasts were obtained. These confirm 2
small rounded, circumscribed nodular densities in the medial right
breast. There is a similar density in the medial left breast. There
is also a small, oval, partially obscured density in the posterior
aspect of the upper left breast.

Mammographic images were processed with CAD.

On physical exam, no mass is palpable in either breast.

Targeted ultrasound is performed, showing multiple small cysts and
clusters of microcysts in both breasts, corresponding to the areas
of nodularity. The largest on the right is in the 1:30 o'clock
position, 4 cm from the nipple, measuring 7 mm in maximum diameter.
The largest on the left is in the 11 o'clock position, 3 cm from the
nipple, measuring 8 mm in maximum diameter.
IMPRESSION: Multiple small bilateral cysts and clusters of microcysts. No
evidence of malignancy.

RECOMMENDATION:
Bilateral screening mammogram in 1 year.

I have discussed the findings and recommendations with the patient.
Results were also provided in writing at the conclusion of the
visit. If applicable, a reminder letter will be sent to the patient
regarding the next appointment.

BI-RADS CATEGORY  2: Benign.

## 2017-01-08 ENCOUNTER — Other Ambulatory Visit: Payer: Self-pay

## 2017-01-08 ENCOUNTER — Ambulatory Visit
Admission: EM | Admit: 2017-01-08 | Discharge: 2017-01-08 | Disposition: A | Discharge status: Home / Self Care | Payer: BLUE CROSS/BLUE SHIELD | Attending: Family Medicine | Admitting: Family Medicine

## 2017-01-08 ENCOUNTER — Encounter: Payer: Self-pay | Admitting: *Deleted

## 2017-01-08 DIAGNOSIS — J069 Acute upper respiratory infection, unspecified: Secondary | ICD-10-CM | POA: Diagnosis not present

## 2017-01-08 DIAGNOSIS — J029 Acute pharyngitis, unspecified: Secondary | ICD-10-CM

## 2017-01-08 DIAGNOSIS — R05 Cough: Secondary | ICD-10-CM

## 2017-01-08 DIAGNOSIS — B9789 Other viral agents as the cause of diseases classified elsewhere: Secondary | ICD-10-CM

## 2017-01-08 LAB — RAPID STREP SCREEN (MED CTR MEBANE ONLY): STREPTOCOCCUS, GROUP A SCREEN (DIRECT): NEGATIVE

## 2017-01-08 MED ORDER — HYDROCOD POLST-CPM POLST ER 10-8 MG/5ML PO SUER: 5.0000 mL | Freq: Two times a day (BID) | ORAL | 0 refills | Status: DC | PRN

## 2017-01-08 MED ORDER — LIDOCAINE VISCOUS 2 % MT SOLN: OROMUCOSAL | 0 refills | Status: DC

## 2017-01-08 NOTE — ED Provider Notes (Signed)
[facility]    CSN: [id number] Arrival date & time: 01/08/17  0909     History   Chief Complaint Chief Complaint  Patient presents with  . Sore Throat  . Nasal Congestion  . Cough    HPI Debra Dixon is a 41 y.o. female.    Sore Throat   Cough  Associated symptoms: rhinorrhea and sore throat   URI  Presenting symptoms: congestion, cough, fatigue, rhinorrhea and sore throat   Severity:  Moderate Onset quality:  Sudden Duration:  5 days Timing:  Constant Progression:  Unchanged Chronicity:  New Relieved by:  Nothing Ineffective treatments:  OTC medications Risk factors: sick contacts   Risk factors: not elderly, no chronic cardiac disease, no chronic kidney disease, no chronic respiratory disease, no immunosuppression, no recent illness and no recent travel     Past Medical History:  Diagnosis Date  . Anxiety    no meds  . Dysrhythmia    Palpatations - echo done 5 yrs ago in [place], no issues.  Marland Kitchen GERD (gastroesophageal reflux disease)   . Headache    caused by cigarette allergy  . Heart murmur    as child  . Seasonal allergies     Patient Active Problem List   Diagnosis Date Noted  . Inflammatory arthritis 09/16/2015  . Anxiety disorder 05/25/2014  . 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 Allergic state 05/25/2014  . H/O abnormal cervical Papanicolaou smear 05/25/2014  . Irritable bowel syndrome with constipation 05/25/2014  . Tobacco use disorder, moderate, in early remission 05/25/2014  . Congestion of nasal sinus 05/25/2014    Past Surgical History:  Procedure Laterality Date  . APPENDECTOMY  1985  . COLONOSCOPY N/A 05/22/2014   Procedure: COLONOSCOPY;  Surgeon: Lucilla Lame, MD;  Location: [place];  Service: Gastroenterology;  Laterality: N/A;  cecum time- 0925   . ESOPHAGOGASTRODUODENOSCOPY N/A 05/22/2014   Procedure: ESOPHAGOGASTRODUODENOSCOPY (EGD);  Surgeon: Lucilla Lame, MD;  Location: [place];  Service:  Gastroenterology;  Laterality: N/A;  . ESOPHAGOGASTRODUODENOSCOPY  05/22/2014  . KNEE ARTHROSCOPY Left 1988  . LEEP  2010  . TUBAL LIGATION  2008    OB History    No data available       Home Medications    Prior to Admission medications   Medication Sig Start Date End Date Taking? Authorizing Provider  aspirin-sod bicarb-citric acid (ALKA-SELTZER) 325 MG TBEF tablet Take 325 mg by mouth as needed.   Yes [provider]  hydroxychloroquine (PLAQUENIL) 200 MG tablet Take 200 mg by mouth 2 (two) times daily.   Yes [provider]  ibuprofen (ADVIL,MOTRIN) 100 MG chewable tablet Chew by mouth as needed.   Yes [provider]  nicotine polacrilex (NICORETTE) 2 MG gum Take 2 mg by mouth as needed.   Yes [provider]  chlorpheniramine-HYDROcodone (TUSSIONEX PENNKINETIC ER) 10-8 MG/5ML SUER Take 5 mLs by mouth every 12 (twelve) hours as needed. 01/08/17   Norval Gable, MD  fluticasone (FLONASE) 50 MCG/ACT nasal spray Place 1 spray into both nostrils daily. AM    [provider]  guaiFENesin-codeine (ROBITUSSIN AC) 100-10 MG/5ML syrup Take 5 mLs by mouth 3 (three) times daily as needed for cough. 04/26/16   Glean Hess, MD  lidocaine (XYLOCAINE) 2 % solution 20 mls gargel and spit q 6 hours prn 01/08/17   Norval Gable, MD  LORazepam (ATIVAN) 0.5 MG tablet Take 1 tablet (0.5 mg total) by mouth 2 (two) times daily as needed  for anxiety. 09/16/15   Glean Hess, MD    Family History Family History  Problem Relation Age of Onset  . Diabetes Mother   . Diabetes Maternal Grandmother   . Breast cancer Maternal Aunt   . Colon cancer Paternal Grandfather     Social History Social History   Tobacco Use  . Smoking status: Former Smoker    Packs/day: 2.00    Years: 25.00    Pack years: 50.00    Last attempt to quit: 05/05/2014    Years since quitting: 2.6  . Smokeless tobacco: Never Used  Substance Use Topics  . Alcohol use: No     Alcohol/week: 0.0 oz    Comment: 1- 2 drinks per year  . Drug use: No     Allergies   Strawberry extract   Review of Systems Review of Systems  Constitutional: Positive for fatigue.  HENT: Positive for congestion, rhinorrhea and sore throat.   Respiratory: Positive for cough.      Physical Exam Triage Vital Signs ED Triage Vitals  Enc Vitals Group     BP 01/08/17 0918 (!) 105/58     Pulse Rate 01/08/17 0918 80     Resp 01/08/17 0918 16     Temp 01/08/17 0918 98.5 F (36.9 C)     Temp Source 01/08/17 0918 Oral     SpO2 01/08/17 0918 100 %     Weight 01/08/17 0919 145 lb (65.8 kg)     Height 01/08/17 0919 5\' 4"  (1.626 m)     Head Circumference --      Peak Flow --      Pain Score 01/08/17 0920 6     Pain Loc --      Pain Edu? --      Excl. in [place]? --    No data found.  Updated Vital Signs BP (!) 105/58 (BP Location: Left Arm)   Pulse 80   Temp 98.5 F (36.9 C) (Oral)   Resp 16   Ht 5\' 4"  (1.626 m)   Wt 145 lb (65.8 kg)   LMP 12/10/2016   SpO2 100%   BMI 24.89 kg/m   Visual Acuity Right Eye Distance:   Left Eye Distance:   Bilateral Distance:    Right Eye Near:   Left Eye Near:    Bilateral Near:     Physical Exam  Constitutional: She appears well-developed and well-nourished. No distress.  HENT:  Head: Normocephalic and atraumatic.  Right Ear: Tympanic membrane, external ear and ear canal normal.  Left Ear: Tympanic membrane, external ear and ear canal normal.  Nose: Mucosal edema and rhinorrhea present. No nose lacerations, sinus tenderness, nasal deformity, septal deviation or nasal septal hematoma. No epistaxis.  No foreign bodies. Right sinus exhibits no maxillary sinus tenderness and no frontal sinus tenderness. Left sinus exhibits no maxillary sinus tenderness and no frontal sinus tenderness.  Mouth/Throat: Uvula is midline, oropharynx is clear and moist and mucous membranes are normal. No oropharyngeal exudate.  Eyes: Conjunctivae and EOM are  normal. Pupils are equal, round, and reactive to light. Right eye exhibits no discharge. Left eye exhibits no discharge. No scleral icterus.  Neck: Normal range of motion. Neck supple. No thyromegaly present.  Cardiovascular: Normal rate, regular rhythm and normal heart sounds.  Pulmonary/Chest: Effort normal and breath sounds normal. No stridor. No respiratory distress. She has no wheezes. She has no rales.  Lymphadenopathy:    She has no cervical adenopathy.  Skin: She is  not diaphoretic.  Nursing note and vitals reviewed.    UC Treatments / Results  Labs (all labs ordered are listed, but only abnormal results are displayed) Labs Reviewed  RAPID STREP SCREEN (NOT AT [facility])  CULTURE, GROUP A STREP [facility])    EKG  EKG Interpretation None       Radiology No results found.  Procedures Procedures (including critical care time)  Medications Ordered in UC Medications - No data to display   Initial Impression / Assessment and Plan / UC Course  I have reviewed the triage vital signs and the nursing notes.  Pertinent labs & imaging results that were available during my care of the patient were reviewed by me and considered in my medical decision making (see chart for details).       Final Clinical Impressions(s) / UC Diagnoses   Final diagnoses:  Pharyngitis, unspecified etiology  Viral URI with cough    ED Discharge Orders        Ordered    lidocaine (XYLOCAINE) 2 % solution     01/08/17 0957    chlorpheniramine-HYDROcodone (TUSSIONEX PENNKINETIC ER) 10-8 MG/5ML SUER  Every 12 hours PRN     01/08/17 0958     1. Lab results and diagnosis reviewed with patient 2. rx as per orders above; reviewed possible side effects, interactions, risks and benefits  3. Recommend supportive treatment with rest, fluids, otc analgesics prn 4. Follow-up prn if symptoms worsen or don't improve   Controlled Substance Prescriptions [place] Controlled Substance Registry consulted? Not  Applicable   Norval Gable, MD 01/08/17 1024

## 2017-01-08 NOTE — ED Triage Notes (Signed)
Patient started having symptoms of core throat, cough, nasal congestion, and right ear pain 5 days ago.

## 2017-01-11 LAB — CULTURE, GROUP A STREP (THRC)

## 2018-03-23 ENCOUNTER — Encounter: Payer: Self-pay | Admitting: Internal Medicine

## 2018-03-23 ENCOUNTER — Other Ambulatory Visit (HOSPITAL_COMMUNITY)
Admission: RE | Admit: 2018-03-23 | Discharge: 2018-03-23 | Disposition: A | Discharge status: Home / Self Care | Payer: BLUE CROSS/BLUE SHIELD | Source: Ambulatory Visit | Attending: Internal Medicine | Admitting: Internal Medicine

## 2018-03-23 ENCOUNTER — Other Ambulatory Visit: Payer: Self-pay

## 2018-03-23 ENCOUNTER — Ambulatory Visit (INDEPENDENT_AMBULATORY_CARE_PROVIDER_SITE_OTHER): Payer: BLUE CROSS/BLUE SHIELD | Admitting: Internal Medicine

## 2018-03-23 VITALS — BP 104/68 | HR 81 | Ht 64.0 in | Wt 139.0 lb

## 2018-03-23 DIAGNOSIS — Z86010 Personal history of colonic polyps: Secondary | ICD-10-CM | POA: Insufficient documentation

## 2018-03-23 DIAGNOSIS — M05742 Rheumatoid arthritis with rheumatoid factor of left hand without organ or systems involvement: Secondary | ICD-10-CM | POA: Diagnosis not present

## 2018-03-23 DIAGNOSIS — Z1231 Encounter for screening mammogram for malignant neoplasm of breast: Secondary | ICD-10-CM

## 2018-03-23 DIAGNOSIS — R0981 Nasal congestion: Secondary | ICD-10-CM

## 2018-03-23 DIAGNOSIS — M05741 Rheumatoid arthritis with rheumatoid factor of right hand without organ or systems involvement: Secondary | ICD-10-CM

## 2018-03-23 DIAGNOSIS — E010 Iodine-deficiency related diffuse (endemic) goiter: Secondary | ICD-10-CM | POA: Diagnosis not present

## 2018-03-23 DIAGNOSIS — Z0000 Encounter for general adult medical examination without abnormal findings: Secondary | ICD-10-CM

## 2018-03-23 DIAGNOSIS — Z124 Encounter for screening for malignant neoplasm of cervix: Secondary | ICD-10-CM | POA: Insufficient documentation

## 2018-03-23 LAB — POCT URINALYSIS DIPSTICK
Bilirubin, UA: NEGATIVE
Glucose, UA: NEGATIVE
Ketones, UA: NEGATIVE
Leukocytes, UA: NEGATIVE
Nitrite, UA: NEGATIVE
PH UA: 7.5 (ref 5.0–8.0)
Protein, UA: NEGATIVE
RBC UA: NEGATIVE
Spec Grav, UA: 1.01 (ref 1.010–1.025)
Urobilinogen, UA: 0.2 E.U./dL

## 2018-03-23 NOTE — Progress Notes (Signed)
Date:  03/23/2018   Name:  Debra Dixon   DOB:  1975-02-25   MRN:  [id number]   Chief Complaint: Annual Exam (Breast Exam and Papsmear. ) Matsue Strom is a 43 y.o. female who presents today for her Complete Annual Exam. She feels fairly well. She reports exercising regularly. She reports she is sleeping fairly well.   Last mammogram: 2016 Last pap smear: 2016 Colonoscopy: 05/2014 Dr. Allen Norris  Sinusitis  This is a new problem. The current episode started 1 to 4 weeks ago. The problem has been gradually improving since onset. There has been no fever. She is experiencing no pain. Pertinent negatives include no chills, congestion, coughing, headaches, shortness of breath, sinus pressure, sore throat or swollen glands. Past treatments include spray decongestants. The treatment provided moderate relief.   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  Mainly involving hands and wrists.  No recent severe flares.   Review of Systems  Constitutional: Negative for chills, fatigue and fever.  HENT: Negative for congestion, hearing loss, sinus pressure, sore throat, tinnitus, trouble swallowing and voice change.   Eyes: Negative for visual disturbance.  Respiratory: Negative for cough, chest tightness, shortness of breath and wheezing.   Cardiovascular: Negative for chest pain, palpitations and leg swelling.  Gastrointestinal: Negative for abdominal pain, constipation, diarrhea and vomiting.  Endocrine: Negative for polydipsia and polyuria.  Genitourinary: Negative for dysuria, frequency, genital sores, vaginal bleeding and vaginal discharge.  Musculoskeletal: Negative for arthralgias, gait problem and joint swelling.  Skin: Negative for color change and rash.  Neurological: Negative for dizziness, tremors, light-headedness and headaches.  Hematological: Negative for adenopathy. Does not bruise/bleed easily.  Psychiatric/Behavioral: Negative for dysphoric mood and sleep  disturbance. The patient is not nervous/anxious.     Patient Active Problem List   Diagnosis Date Noted  . Hx of colonic polyps 03/23/2018  . Rheumatoid arthritis involving both hands with positive rheumatoid factor ([place]) 09/16/2015  . Anxiety disorder 05/25/2014  . Allergic state 05/25/2014  . H/O abnormal cervical Papanicolaou smear 05/25/2014  . Irritable bowel syndrome with constipation 05/25/2014  . Tobacco use disorder, moderate, in early remission 05/25/2014  . Congestion of nasal sinus 05/25/2014  . Thyromegaly 10/19/2012    Allergies  Allergen Reactions  . Strawberry Extract Rash    Past Surgical History:  Procedure Laterality Date  . APPENDECTOMY  1985  . COLONOSCOPY N/A 05/22/2014   Procedure: COLONOSCOPY;  Surgeon: Lucilla Lame, MD;  Location: [place];  Service: Gastroenterology;  Laterality: N/A;  cecum time- 0925   . ESOPHAGOGASTRODUODENOSCOPY N/A 05/22/2014   Procedure: ESOPHAGOGASTRODUODENOSCOPY (EGD);  Surgeon: Lucilla Lame, MD;  Location: [place];  Service: Gastroenterology;  Laterality: N/A;  . ESOPHAGOGASTRODUODENOSCOPY  05/22/2014  . KNEE ARTHROSCOPY Left 1988  . LEEP  2010  . TUBAL LIGATION  2008    Social History   Tobacco Use  . Smoking status: Former Smoker    Packs/day: 2.00    Years: 25.00    Pack years: 50.00    Last attempt to quit: 05/05/2014    Years since quitting: 3.8  . Smokeless tobacco: Never Used  . Tobacco comment: Nicotine Tabs  Substance Use Topics  . Alcohol use: No    Alcohol/week: 0.0 standard drinks    Comment: 1- 2 drinks per year  . Drug use: No     Medication list has been reviewed and updated.  Current Meds  Medication Sig  . fluticasone (FLONASE) 50 MCG/ACT nasal spray  Place 1 spray into both nostrils daily. AM  . hydroxychloroquine (PLAQUENIL) 200 MG tablet Take 200 mg by mouth 2 (two) times daily. Dr. Brent Bulla    [facility] 2/9 Scores 03/23/2018  PHQ - 2 Score 0    Physical Exam  Vitals signs and nursing note reviewed.  Constitutional:      General: She is not in acute distress.    Appearance: She is well-developed.  HENT:     Head: Normocephalic and atraumatic.     Right Ear: Tympanic membrane and ear canal normal.     Left Ear: Tympanic membrane and ear canal normal.     Nose:     Right Sinus: No maxillary sinus tenderness.     Left Sinus: No maxillary sinus tenderness.     Mouth/Throat:     Pharynx: Uvula midline.  Eyes:     General: No scleral icterus.       Right eye: No discharge.        Left eye: No discharge.     Conjunctiva/sclera: Conjunctivae normal.  Neck:     Musculoskeletal: Normal range of motion. No erythema.     Thyroid: No thyromegaly.     Vascular: No carotid bruit.  Cardiovascular:     Rate and Rhythm: Normal rate and regular rhythm.     Pulses: Normal pulses.     Heart sounds: Normal heart sounds.  Pulmonary:     Effort: Pulmonary effort is normal. No respiratory distress.     Breath sounds: No wheezing.  Chest:     Breasts:        Right: No mass, nipple discharge, skin change or tenderness.        Left: No mass, nipple discharge, skin change or tenderness.  Abdominal:     General: Bowel sounds are normal.     Palpations: Abdomen is soft.     Tenderness: There is no abdominal tenderness.  Genitourinary:    Labia:        Right: No tenderness, lesion or injury.        Left: No tenderness, lesion or injury.      Vagina: Normal.     Cervix: Erythema present. No discharge, lesion or cervical bleeding.     Uterus: Normal.      Adnexa: Right adnexa normal and left adnexa normal.  Musculoskeletal: Normal range of motion.     Comments: Mild synovitis of MCP and PIP joints both hands Mild synovitis of both wrists  Lymphadenopathy:     Cervical: No cervical adenopathy.  Skin:    General: Skin is warm and dry.     Findings: No rash.  Neurological:     Mental Status: She is alert and oriented to person, place, and time.      Cranial Nerves: No cranial nerve deficit.     Sensory: No sensory deficit.     Deep Tendon Reflexes: Reflexes are normal and symmetric.  Psychiatric:        Attention and Perception: Attention normal.        Mood and Affect: Mood normal.        Speech: Speech normal.        Behavior: Behavior normal.        Thought Content: Thought content normal.        Cognition and Memory: Cognition normal.     Wt Readings from Last 3 Encounters:  03/23/18 139 lb (63 kg)  01/08/17 145 lb (65.8 kg)  04/26/16 154 lb (69.9  kg)    BP 104/68   Pulse 81   Ht 5\' 4"  (1.626 m)   Wt 139 lb (63 kg)   LMP 03/06/2018 (Approximate)   SpO2 100%   BMI 23.86 kg/m   Assessment and Plan: 1. Annual physical exam Normal exam - POCT urinalysis dipstick - Lipid panel  2. Encounter for screening mammogram for breast cancer Continue annual mammograms - MM 3D SCREEN BREAST BILATERAL; Future  3. Encounter for Papanicolaou smear for cervical cancer screening Obtained today - Cytology - PAP  4. Rheumatoid arthritis involving both hands with positive rheumatoid factor (HCC) Followed by [facility], stable sx on Plaquenil - CBC with Differential/Platelet - Comprehensive metabolic panel  5. Thyromegaly Check labs - TSH  6. Hx of colonic polyps Due for colonoscopy next year  7. Sinus congestion Continue flonase Add Sudafed prn congestion No indication for antibiotics at this time   Partially dictated using Editor, commissioning. Any errors are unintentional.  Halina Maidens, MD [facility]  03/23/2018

## 2018-03-24 LAB — CBC WITH DIFFERENTIAL/PLATELET
Basophils Absolute: 0.1 10*3/uL (ref 0.0–0.2)
Basos: 2 %
EOS (ABSOLUTE): 0.1 10*3/uL (ref 0.0–0.4)
EOS: 1 %
Hematocrit: 40.3 % (ref 34.0–46.6)
Hemoglobin: 13.4 g/dL (ref 11.1–15.9)
IMMATURE GRANS (ABS): 0 10*3/uL (ref 0.0–0.1)
IMMATURE GRANULOCYTES: 0 %
LYMPHS ABS: 0.9 10*3/uL (ref 0.7–3.1)
Lymphs: 16 %
MCH: 30.5 pg (ref 26.6–33.0)
MCHC: 33.3 g/dL (ref 31.5–35.7)
MCV: 92 fL (ref 79–97)
MONOCYTES: 9 %
Monocytes Absolute: 0.5 10*3/uL (ref 0.1–0.9)
NEUTROS PCT: 72 %
Neutrophils Absolute: 4 10*3/uL (ref 1.4–7.0)
Platelets: 328 10*3/uL (ref 150–450)
RBC: 4.39 x10E6/uL (ref 3.77–5.28)
RDW: 12 % (ref 11.7–15.4)
WBC: 5.6 10*3/uL (ref 3.4–10.8)

## 2018-03-24 LAB — COMPREHENSIVE METABOLIC PANEL
ALK PHOS: 39 IU/L (ref 39–117)
ALT: 18 IU/L (ref 0–32)
AST: 16 IU/L (ref 0–40)
Albumin/Globulin Ratio: 1.9 (ref 1.2–2.2)
Albumin: 4.6 g/dL (ref 3.8–4.8)
BUN / CREAT RATIO: 9 (ref 9–23)
BUN: 7 mg/dL (ref 6–24)
Bilirubin Total: 0.5 mg/dL (ref 0.0–1.2)
CO2: 24 mmol/L (ref 20–29)
Calcium: 9.4 mg/dL (ref 8.7–10.2)
Chloride: 101 mmol/L (ref 96–106)
Creatinine, Ser: 0.8 mg/dL (ref 0.57–1.00)
GFR calc Af Amer: 104 mL/min/{1.73_m2} (ref >59)
GFR calc non Af Amer: 90 mL/min/{1.73_m2} (ref >59)
Globulin, Total: 2.4 g/dL (ref 1.5–4.5)
Glucose: 86 mg/dL (ref 65–99)
Potassium: 4.6 mmol/L (ref 3.5–5.2)
Sodium: 140 mmol/L (ref 134–144)
Total Protein: 7 g/dL (ref 6.0–8.5)

## 2018-03-24 LAB — LIPID PANEL
Chol/HDL Ratio: 3.2 ratio (ref 0.0–4.4)
Cholesterol, Total: 194 mg/dL (ref 100–199)
HDL: 60 mg/dL (ref >39)
LDL Calculated: 122 mg/dL — ABNORMAL HIGH (ref 0–99)
Triglycerides: 62 mg/dL (ref 0–149)
VLDL Cholesterol Cal: 12 mg/dL (ref 5–40)

## 2018-03-24 LAB — TSH: TSH: 1.17 u[IU]/mL (ref 0.450–4.500)

## 2018-03-27 LAB — CYTOLOGY - PAP
Diagnosis: NEGATIVE
HPV: NOT DETECTED

## 2018-05-11 ENCOUNTER — Ambulatory Visit: Payer: BLUE CROSS/BLUE SHIELD

## 2018-07-27 ENCOUNTER — Ambulatory Visit
Admission: RE | Admit: 2018-07-27 | Discharge: 2018-07-27 | Disposition: A | Discharge status: Home / Self Care | Payer: BC Managed Care – PPO | Source: Ambulatory Visit | Attending: Internal Medicine | Admitting: Internal Medicine

## 2018-07-27 ENCOUNTER — Other Ambulatory Visit: Payer: Self-pay

## 2018-07-27 DIAGNOSIS — Z1231 Encounter for screening mammogram for malignant neoplasm of breast: Secondary | ICD-10-CM | POA: Insufficient documentation

## 2019-02-20 ENCOUNTER — Encounter: Payer: Self-pay | Admitting: Internal Medicine

## 2019-02-20 ENCOUNTER — Other Ambulatory Visit: Payer: Self-pay | Admitting: Internal Medicine

## 2019-02-20 DIAGNOSIS — K581 Irritable bowel syndrome with constipation: Secondary | ICD-10-CM

## 2019-02-20 MED ORDER — LINACLOTIDE 145 MCG PO CAPS: 145.0000 ug | ORAL_CAPSULE | Freq: Every day | ORAL | 1 refills | Status: DC

## 2019-02-20 NOTE — Telephone Encounter (Signed)
Please advise 

## 2019-03-29 ENCOUNTER — Ambulatory Visit (INDEPENDENT_AMBULATORY_CARE_PROVIDER_SITE_OTHER): Payer: BC Managed Care – PPO | Admitting: Internal Medicine

## 2019-03-29 ENCOUNTER — Encounter: Payer: Self-pay | Admitting: Internal Medicine

## 2019-03-29 ENCOUNTER — Other Ambulatory Visit: Payer: Self-pay

## 2019-03-29 VITALS — BP 112/64 | HR 80 | Temp 97.8°F | Ht 64.0 in | Wt 129.0 lb

## 2019-03-29 DIAGNOSIS — E010 Iodine-deficiency related diffuse (endemic) goiter: Secondary | ICD-10-CM | POA: Diagnosis not present

## 2019-03-29 DIAGNOSIS — K581 Irritable bowel syndrome with constipation: Secondary | ICD-10-CM | POA: Diagnosis not present

## 2019-03-29 DIAGNOSIS — F17201 Nicotine dependence, unspecified, in remission: Secondary | ICD-10-CM

## 2019-03-29 DIAGNOSIS — Z0000 Encounter for general adult medical examination without abnormal findings: Secondary | ICD-10-CM

## 2019-03-29 DIAGNOSIS — Z1231 Encounter for screening mammogram for malignant neoplasm of breast: Secondary | ICD-10-CM

## 2019-03-29 DIAGNOSIS — M05741 Rheumatoid arthritis with rheumatoid factor of right hand without organ or systems involvement: Secondary | ICD-10-CM

## 2019-03-29 DIAGNOSIS — M05742 Rheumatoid arthritis with rheumatoid factor of left hand without organ or systems involvement: Secondary | ICD-10-CM

## 2019-03-29 LAB — POCT URINALYSIS DIPSTICK
Bilirubin, UA: NEGATIVE
Blood, UA: NEGATIVE
Glucose, UA: NEGATIVE
Ketones, UA: NEGATIVE
Nitrite, UA: NEGATIVE
Protein, UA: NEGATIVE
Spec Grav, UA: 1.01 (ref 1.010–1.025)
Urobilinogen, UA: 0.2 E.U./dL
pH, UA: 5 (ref 5.0–8.0)

## 2019-03-29 NOTE — Progress Notes (Signed)
Date:  03/29/2019   Name:  Debra Dixon   DOB:  [birth year]   MRN:  [id number]   Chief Complaint: Annual Exam (Breast Exam. No pap. ) Esosa Rasberry is a 44 y.o. female who presents today for her Complete Annual Exam. She feels well. She reports exercising regularly. She reports she is sleeping fairly well. She denies breast issues.  Mammogram  07/2018 Pap  03/2018 neg with cotesting  There is no immunization history on file for this patient.  Constipation This is a chronic problem. The problem has been gradually worsening since onset. The stool is described as pellet like. The patient is on a high fiber diet. She exercises regularly. There has been adequate water intake. Associated symptoms include bloating and flatus. Pertinent negatives include no abdominal pain, diarrhea, fever or vomiting. Treatments tried: linzess.  She has resorted to using laxatives times per week.  Her insurance would not cover Linzess or Amitiza.  RA - joints involved are hands and wrists.  Symptoms are stable on Plaquenil.  No recent Rheum visits.  No eye issues- recent eye exam done with new bifocals given.  Lab Results  Component Value Date   CREATININE 0.80 03/23/2018   BUN 7 03/23/2018   NA 140 03/23/2018   K 4.6 03/23/2018   CL 101 03/23/2018   CO2 24 03/23/2018   Lab Results  Component Value Date   CHOL 194 03/23/2018   HDL 60 03/23/2018   LDLCALC 122 (H) 03/23/2018   TRIG 62 03/23/2018   CHOLHDL 3.2 03/23/2018   Lab Results  Component Value Date   TSH 1.170 03/23/2018   No results found for: HGBA1C Lab Results  Component Value Date   WBC 5.6 03/23/2018   HGB 13.4 03/23/2018   HCT 40.3 03/23/2018   MCV 92 03/23/2018   PLT 328 03/23/2018   Lab Results  Component Value Date   ALT 18 03/23/2018   AST 16 03/23/2018   ALKPHOS 39 03/23/2018   BILITOT 0.5 03/23/2018     Review of Systems  Constitutional: Negative for chills, fatigue and fever.  HENT: Negative for congestion,  hearing loss, tinnitus, trouble swallowing and voice change.   Eyes: Negative for visual disturbance.  Respiratory: Negative for cough, chest tightness, shortness of breath and wheezing.   Cardiovascular: Positive for palpitations (when anxious). Negative for chest pain and leg swelling.  Gastrointestinal: Positive for bloating, constipation and flatus. Negative for abdominal pain, diarrhea and vomiting.  Endocrine: Negative for polydipsia and polyuria.  Genitourinary: Negative for dysuria, frequency, genital sores, menstrual problem, vaginal bleeding and vaginal discharge.  Musculoskeletal: Negative for arthralgias, gait problem and joint swelling.  Skin: Negative for color change and rash.  Neurological: Negative for dizziness, tremors, light-headedness and headaches.  Hematological: Negative for adenopathy. Does not bruise/bleed easily.  Psychiatric/Behavioral: Negative for dysphoric mood and sleep disturbance. The patient is not nervous/anxious.     Patient Active Problem List   Diagnosis Date Noted  . Hx of colonic polyps 03/23/2018  . Rheumatoid arthritis involving both hands with positive rheumatoid factor ([place]) 09/16/2015  . Anxiety disorder 05/25/2014  . Allergic state 05/25/2014  . H/O abnormal cervical Papanicolaou smear 05/25/2014  . Irritable bowel syndrome with constipation 05/25/2014  . Tobacco use disorder, moderate, in early remission 05/25/2014  . Congestion of nasal sinus 05/25/2014  . Thyromegaly 10/19/2012    Allergies  Allergen Reactions  . Strawberry Extract Rash    Past Surgical History:  Procedure Laterality Date  .  APPENDECTOMY  1985  . COLONOSCOPY N/A 05/22/2014   Procedure: COLONOSCOPY;  Surgeon: Lucilla Lame, MD;  Location: [facility];  Service: Gastroenterology;  Laterality: N/A;  cecum time- 0925   . ESOPHAGOGASTRODUODENOSCOPY N/A 05/22/2014   Procedure: ESOPHAGOGASTRODUODENOSCOPY (EGD);  Surgeon: Lucilla Lame, MD;  Location: [facility];  Service: Gastroenterology;  Laterality: N/A;  . ESOPHAGOGASTRODUODENOSCOPY  05/22/2014  . KNEE ARTHROSCOPY Left 1988  . LEEP  2010  . TUBAL LIGATION  2008    Social History   Tobacco Use  . Smoking status: Former Smoker    Packs/day: 2.00    Years: 25.00    Pack years: 50.00    Quit date: 05/05/2014    Years since quitting: 4.9  . Smokeless tobacco: Never Used  . Tobacco comment: Nicotine Tabs  Substance Use Topics  . Alcohol use: No    Alcohol/week: 0.0 standard drinks    Comment: 1- 2 drinks per year  . Drug use: No     Medication list has been reviewed and updated.  Current Meds  Medication Sig  . fluticasone (FLONASE) 50 MCG/ACT nasal spray Place 1 spray into both nostrils daily. AM  . hydroxychloroquine (PLAQUENIL) 200 MG tablet Take 200 mg by mouth 2 (two) times daily. Dr. Brent Bulla    [facility] 2/9 Scores 03/29/2019 03/23/2018  PHQ - 2 Score 0 0  PHQ- 9 Score 2 -    BP Readings from Last 3 Encounters:  03/29/19 112/64  03/23/18 104/68  01/08/17 (!) 105/58    Physical Exam Vitals and nursing note reviewed.  Constitutional:      General: She is not in acute distress.    Appearance: She is well-developed.  HENT:     Head: Normocephalic and atraumatic.     Right Ear: Tympanic membrane and ear canal normal.     Left Ear: Tympanic membrane and ear canal normal.     Nose:     Right Sinus: No maxillary sinus tenderness.     Left Sinus: No maxillary sinus tenderness.  Eyes:     General: No scleral icterus.       Right eye: No discharge.        Left eye: No discharge.     Conjunctiva/sclera: Conjunctivae normal.  Neck:     Thyroid: No thyromegaly.     Vascular: No carotid bruit.  Cardiovascular:     Rate and Rhythm: Normal rate and regular rhythm.     Pulses: Normal pulses.     Heart sounds: Normal heart sounds.  Pulmonary:     Effort: Pulmonary effort is normal. No respiratory distress.     Breath sounds: No wheezing.  Chest:     Breasts:         Right: No mass, nipple discharge, skin change or tenderness.        Left: No mass, nipple discharge, skin change or tenderness.  Abdominal:     General: Bowel sounds are normal.     Palpations: Abdomen is soft.     Tenderness: There is no abdominal tenderness.  Musculoskeletal:        General: Swelling (mild synovitis of MCPs both hands) present.     Cervical back: Normal range of motion. No erythema.     Right lower leg: No edema.     Left lower leg: No edema.  Lymphadenopathy:     Cervical: No cervical adenopathy.  Skin:    General: Skin is warm and dry.     Findings:  No rash.  Neurological:     Mental Status: She is alert and oriented to person, place, and time.     Cranial Nerves: No cranial nerve deficit.     Sensory: No sensory deficit.     Deep Tendon Reflexes: Reflexes are normal and symmetric.  Psychiatric:        Speech: Speech normal.        Behavior: Behavior normal.        Thought Content: Thought content normal.     Wt Readings from Last 3 Encounters:  03/29/19 129 lb (58.5 kg)  03/23/18 139 lb (63 kg)  01/08/17 145 lb (65.8 kg)    BP 112/64   Pulse 80   Temp 97.8 F (36.6 C) (Temporal)   Ht 5\' 4"  (1.626 m)   Wt 129 lb (58.5 kg)   LMP 03/10/2019 (Exact Date)   SpO2 100%   BMI 22.14 kg/m   Assessment and Plan: 1. Annual physical exam Normal exam except for weight loss due to decreased intake to avoid gas  2. Encounter for screening mammogram for breast cancer Schedule at [facility]  3. Irritable bowel syndrome with constipation Samples of Linzess given Will refer to GI - maybe they can help her with patient assistance or another medication - Ambulatory referral to Gastroenterology  4. Rheumatoid arthritis involving both hands with positive rheumatoid factor (HCC) Stable joint symptoms on Plaquenil She is up to date on eye exam but has not seen Rheumatology is more than one year  5. Tobacco use disorder, moderate, in early  remission Continues tobacco free   Partially dictated using Editor, commissioning. Any errors are unintentional.  Halina Maidens, MD [facility]  03/29/2019

## 2019-03-30 ENCOUNTER — Encounter: Payer: Self-pay | Admitting: Internal Medicine

## 2019-03-30 LAB — COMPREHENSIVE METABOLIC PANEL
ALT: 12 IU/L (ref 0–32)
AST: 13 IU/L (ref 0–40)
Albumin/Globulin Ratio: 2.1 (ref 1.2–2.2)
Albumin: 4.8 g/dL (ref 3.8–4.8)
Alkaline Phosphatase: 38 IU/L — ABNORMAL LOW (ref 39–117)
BUN/Creatinine Ratio: 10 (ref 9–23)
BUN: 7 mg/dL (ref 6–24)
Bilirubin Total: 0.6 mg/dL (ref 0.0–1.2)
CO2: 24 mmol/L (ref 20–29)
Calcium: 9.7 mg/dL (ref 8.7–10.2)
Chloride: 101 mmol/L (ref 96–106)
Creatinine, Ser: 0.73 mg/dL (ref 0.57–1.00)
GFR calc Af Amer: 115 mL/min/{1.73_m2} (ref >59)
GFR calc non Af Amer: 100 mL/min/{1.73_m2} (ref >59)
Globulin, Total: 2.3 g/dL (ref 1.5–4.5)
Glucose: 98 mg/dL (ref 65–99)
Potassium: 4.3 mmol/L (ref 3.5–5.2)
Sodium: 139 mmol/L (ref 134–144)
Total Protein: 7.1 g/dL (ref 6.0–8.5)

## 2019-03-30 LAB — CBC WITH DIFFERENTIAL/PLATELET
Basophils Absolute: 0.1 10*3/uL (ref 0.0–0.2)
Basos: 1 %
EOS (ABSOLUTE): 0.1 10*3/uL (ref 0.0–0.4)
Eos: 2 %
Hematocrit: 39 % (ref 34.0–46.6)
Hemoglobin: 13.3 g/dL (ref 11.1–15.9)
Immature Grans (Abs): 0 10*3/uL (ref 0.0–0.1)
Immature Granulocytes: 0 %
Lymphocytes Absolute: 1 10*3/uL (ref 0.7–3.1)
Lymphs: 13 %
MCH: 31.1 pg (ref 26.6–33.0)
MCHC: 34.1 g/dL (ref 31.5–35.7)
MCV: 91 fL (ref 79–97)
Monocytes Absolute: 0.7 10*3/uL (ref 0.1–0.9)
Monocytes: 10 %
Neutrophils Absolute: 5.5 10*3/uL (ref 1.4–7.0)
Neutrophils: 74 %
Platelets: 281 10*3/uL (ref 150–450)
RBC: 4.28 x10E6/uL (ref 3.77–5.28)
RDW: 11.9 % (ref 11.7–15.4)
WBC: 7.5 10*3/uL (ref 3.4–10.8)

## 2019-03-30 LAB — LIPID PANEL
Chol/HDL Ratio: 2.9 ratio (ref 0.0–4.4)
Cholesterol, Total: 177 mg/dL (ref 100–199)
HDL: 62 mg/dL (ref >39)
LDL Chol Calc (NIH): 103 mg/dL — ABNORMAL HIGH (ref 0–99)
Triglycerides: 60 mg/dL (ref 0–149)
VLDL Cholesterol Cal: 12 mg/dL (ref 5–40)

## 2019-03-30 LAB — TSH+FREE T4
Free T4: 1.54 ng/dL (ref 0.82–1.77)
TSH: 1.37 u[IU]/mL (ref 0.450–4.500)

## 2019-04-02 ENCOUNTER — Encounter: Payer: Self-pay | Admitting: Gastroenterology

## 2019-04-03 ENCOUNTER — Telehealth: Payer: Self-pay

## 2019-04-03 ENCOUNTER — Encounter: Payer: Self-pay | Admitting: Gastroenterology

## 2019-04-03 ENCOUNTER — Ambulatory Visit (INDEPENDENT_AMBULATORY_CARE_PROVIDER_SITE_OTHER): Payer: BC Managed Care – PPO | Admitting: Gastroenterology

## 2019-04-03 DIAGNOSIS — K5900 Constipation, unspecified: Secondary | ICD-10-CM | POA: Diagnosis not present

## 2019-04-03 MED ORDER — LUBIPROSTONE 8 MCG PO CAPS: 8.0000 ug | ORAL_CAPSULE | Freq: Two times a day (BID) | ORAL | 2 refills | Status: AC

## 2019-04-03 NOTE — Progress Notes (Signed)
Debra Dixon [address]  [place]  [place], [place] [address]  Main: [phone]  Fax: [phone]   Gastroenterology Consultation  Referring Provider:     Glean Hess, MD Primary Care Physician:  Glean Hess, MD Reason for Consultation:    Constipation        HPI:   Virtual Visit via Video Note  I connected with patient on 04/03/19 at 10:00 AM EDT by video ([email or web address]) and verified that I am speaking with the correct person using two identifiers.   I discussed the limitations, risks, security and privacy concerns of performing an evaluation and management service by video and the availability of in person appointments. I also discussed with the patient that there may be a patient responsible charge related to this service. The patient expressed understanding and agreed to proceed.  Location of the patient: Home Location of provider: Home Participating persons: Patient and provider only (Nursing staff checked in patient via phone but were not physically involved in the video interaction - see their notes)   History of Present Illness: Chief Complaint  Patient presents with  . New Patient (Initial Visit)  . Constipation    Patient states she has to take Laxactive every day. She states she is taking Linzess but insuance company will not pay for it     Debra Dixon is a 44 y.o. y/o female referred for consultation & management  by Dr. Army Melia, Jesse Sans, MD.  Patient reports history of chronic constipation and needing to use medications to help her go.  Currently using things like prune juice which helps, but causes bloating.  Has used MiraLAX in the past.  Recently was given Linzess samples as Linzess works well for her but her insurance is not covering it.  No blood in stool.  No immediate family members with colon cancer.  Had a colonoscopy in May 2016 with Dr. Allen Norris with 1 small tubular adenoma polyp removed.  Repeat recommended in 5 years.  Also had  an EGD at the time due to dysphagia which was unrevealing.  Currently no solid food or liquid dysphagia.  Has some issues with swallowing with pills but otherwise no problems with food impactions or any solid foods.  Past Medical History:  Diagnosis Date  . Anxiety    no meds  . Dysrhythmia    Palpatations - echo done 5 yrs ago in [place], no issues.  Marland Kitchen GERD (gastroesophageal reflux disease)   . Headache    caused by cigarette allergy  . Heart murmur    as child  . Seasonal allergies     Past Surgical History:  Procedure Laterality Date  . APPENDECTOMY  1985  . COLONOSCOPY N/A 05/22/2014   Procedure: COLONOSCOPY;  Surgeon: Lucilla Lame, MD;  Location: [facility];  Service: Gastroenterology;  Laterality: N/A;  cecum time- 0925   . ESOPHAGOGASTRODUODENOSCOPY N/A 05/22/2014   Procedure: ESOPHAGOGASTRODUODENOSCOPY (EGD);  Surgeon: Lucilla Lame, MD;  Location: [facility];  Service: Gastroenterology;  Laterality: N/A;  . ESOPHAGOGASTRODUODENOSCOPY  05/22/2014  . KNEE ARTHROSCOPY Left 1988  . LEEP  2010  . TUBAL LIGATION  2008    Prior to Admission medications   Medication Sig Start Date End Date Taking? Authorizing Provider  fluticasone (FLONASE) 50 MCG/ACT nasal spray Place 1 spray into both nostrils daily. AM   Yes [provider]  hydroxychloroquine (PLAQUENIL) 200 MG tablet Take 200 mg by mouth 2 (two) times daily. Dr. Brent Bulla  Yes [provider]    Family History  Problem Relation Age of Onset  . Diabetes Mother   . BRCA 1/2 Mother   . Diabetes Maternal Grandmother   . Breast cancer Maternal Aunt   . Colon cancer Paternal Grandfather   . Breast cancer Sister      Social History   Tobacco Use  . Smoking status: Former Smoker    Packs/day: 2.00    Years: 25.00    Pack years: 50.00    Quit date: 05/05/2014    Years since quitting: 4.9  . Smokeless tobacco: Never Used  . Tobacco comment: Nicotine Tabs  Substance Use Topics    . Alcohol use: No    Alcohol/week: 0.0 standard drinks    Comment: 1- 2 drinks per year  . Drug use: No    Allergies as of 04/03/2019 - Review Complete 04/03/2019  Allergen Reaction Noted  . Strawberry extract Rash 01/08/2017    Review of Systems:    All systems reviewed and negative except where noted in HPI.   Observations/Objective:  Labs: CBC    Component Value Date/Time   WBC 7.5 03/29/2019 0842   RBC 4.28 03/29/2019 0842   HGB 13.3 03/29/2019 0842   HCT 39.0 03/29/2019 0842   PLT 281 03/29/2019 0842   MCV 91 03/29/2019 0842   MCH 31.1 03/29/2019 0842   MCHC 34.1 03/29/2019 0842   RDW 11.9 03/29/2019 0842   LYMPHSABS 1.0 03/29/2019 0842   EOSABS 0.1 03/29/2019 0842   BASOSABS 0.1 03/29/2019 0842   CMP     Component Value Date/Time   NA 139 03/29/2019 0842   K 4.3 03/29/2019 0842   CL 101 03/29/2019 0842   CO2 24 03/29/2019 0842   GLUCOSE 98 03/29/2019 0842   BUN 7 03/29/2019 0842   CREATININE 0.73 03/29/2019 0842   CALCIUM 9.7 03/29/2019 0842   PROT 7.1 03/29/2019 0842   ALBUMIN 4.8 03/29/2019 0842   AST 13 03/29/2019 0842   ALT 12 03/29/2019 0842   ALKPHOS 38 (L) 03/29/2019 0842   BILITOT 0.6 03/29/2019 0842   GFRNONAA 100 03/29/2019 0842   GFRAA 115 03/29/2019 0842    Imaging Studies: No results found.  Assessment and Plan:   Debra Dixon is a 44 y.o. y/o female has been referred for constipation  Assessment and Plan: Patient has tried MiraLAX, high-fiber diet and other diet changes in the past which helped, but caused bloating  Linzess works but is too expensive as insurance is not covering it.  Will send Amitiza over to the pharmacy to see if it is cheaper for the patient, and can try Trulance instead if not  Continue high-fiber diet  No solid or liquid food dysphagia  Pt not intersted in scheduling her polyp surveillance colonoscopy at this time.  Follow-up in 3 months and can discuss again at that time Follow Up  Instructions:   I discussed the assessment and treatment plan with the patient. The patient was provided an opportunity to ask questions and all were answered. The patient agreed with the plan and demonstrated an understanding of the instructions.   The patient was advised to call back or seek an in-person evaluation if the symptoms worsen or if the condition fails to improve as anticipated.  I provided 15 minutes of face-to-face time via video software during this encounter.  Additional time was spent in reviewing patient's chart, placing orders etc.   Virgel Manifold, MD  Speech recognition software  was used to dictate the above note.

## 2019-04-03 NOTE — Patient Instructions (Signed)

## 2019-04-03 NOTE — Telephone Encounter (Signed)
[employer] company at [phone] and insurance company do not do a tier exception. Patient would have to call insurance company to talk to them about her deductible Patient can do a patient assistance program for the [place]. Found patient assistance through [email or web address] patient assistance. I found the medication and printed the form. Informed patient she would have to fill out her portion of the form before I could fax it. Patient states that she will look up the form online and fill out her portion and bring it by our office when she can

## 2019-06-12 DIAGNOSIS — M05742 Rheumatoid arthritis with rheumatoid factor of left hand without organ or systems involvement: Secondary | ICD-10-CM | POA: Diagnosis not present

## 2019-06-12 DIAGNOSIS — M069 Rheumatoid arthritis, unspecified: Secondary | ICD-10-CM | POA: Diagnosis not present

## 2019-06-12 DIAGNOSIS — M1230 Palindromic rheumatism, unspecified site: Secondary | ICD-10-CM | POA: Diagnosis not present

## 2019-06-12 DIAGNOSIS — M05741 Rheumatoid arthritis with rheumatoid factor of right hand without organ or systems involvement: Secondary | ICD-10-CM | POA: Diagnosis not present

## 2019-06-12 DIAGNOSIS — Z6821 Body mass index (BMI) 21.0-21.9, adult: Secondary | ICD-10-CM | POA: Diagnosis not present

## 2019-06-12 DIAGNOSIS — M25571 Pain in right ankle and joints of right foot: Secondary | ICD-10-CM | POA: Diagnosis not present

## 2019-06-12 DIAGNOSIS — M79643 Pain in unspecified hand: Secondary | ICD-10-CM | POA: Diagnosis not present

## 2019-08-02 ENCOUNTER — Ambulatory Visit
Admission: RE | Admit: 2019-08-02 | Discharge: 2019-08-02 | Disposition: A | Discharge status: Home / Self Care | Payer: BC Managed Care – PPO | Source: Ambulatory Visit | Attending: Internal Medicine | Admitting: Internal Medicine

## 2019-08-02 DIAGNOSIS — Z1231 Encounter for screening mammogram for malignant neoplasm of breast: Secondary | ICD-10-CM | POA: Insufficient documentation

## 2019-10-11 DIAGNOSIS — Z20822 Contact with and (suspected) exposure to covid-19: Secondary | ICD-10-CM | POA: Diagnosis not present

## 2019-10-14 ENCOUNTER — Encounter: Payer: Self-pay | Admitting: Internal Medicine

## 2019-10-20 ENCOUNTER — Encounter: Payer: Self-pay | Admitting: Physician Assistant

## 2019-10-20 ENCOUNTER — Emergency Department
Admission: EM | Admit: 2019-10-20 | Discharge: 2019-10-20 | Disposition: A | Discharge status: Home / Self Care | Payer: BC Managed Care – PPO | Attending: Emergency Medicine | Admitting: Emergency Medicine

## 2019-10-20 ENCOUNTER — Other Ambulatory Visit: Payer: Self-pay | Admitting: Physician Assistant

## 2019-10-20 ENCOUNTER — Ambulatory Visit (HOSPITAL_COMMUNITY): Payer: BC Managed Care – PPO

## 2019-10-20 ENCOUNTER — Emergency Department: Payer: BC Managed Care – PPO

## 2019-10-20 ENCOUNTER — Other Ambulatory Visit: Payer: Self-pay

## 2019-10-20 ENCOUNTER — Ambulatory Visit (HOSPITAL_COMMUNITY)
Admission: RE | Admit: 2019-10-20 | Discharge: 2019-10-20 | Disposition: A | Discharge status: Home / Self Care | Payer: BC Managed Care – PPO | Source: Ambulatory Visit | Attending: Pulmonary Disease | Admitting: Pulmonary Disease

## 2019-10-20 ENCOUNTER — Telehealth (HOSPITAL_COMMUNITY): Payer: Self-pay | Admitting: Emergency Medicine

## 2019-10-20 DIAGNOSIS — R109 Unspecified abdominal pain: Secondary | ICD-10-CM | POA: Diagnosis not present

## 2019-10-20 DIAGNOSIS — R5383 Other fatigue: Secondary | ICD-10-CM | POA: Diagnosis not present

## 2019-10-20 DIAGNOSIS — U071 COVID-19: Secondary | ICD-10-CM | POA: Diagnosis not present

## 2019-10-20 DIAGNOSIS — M069 Rheumatoid arthritis, unspecified: Secondary | ICD-10-CM

## 2019-10-20 DIAGNOSIS — Z8616 Personal history of COVID-19: Secondary | ICD-10-CM | POA: Insufficient documentation

## 2019-10-20 DIAGNOSIS — R0602 Shortness of breath: Secondary | ICD-10-CM | POA: Diagnosis not present

## 2019-10-20 DIAGNOSIS — Z5321 Procedure and treatment not carried out due to patient leaving prior to being seen by health care provider: Secondary | ICD-10-CM | POA: Insufficient documentation

## 2019-10-20 HISTORY — DX: Rheumatoid arthritis, unspecified: M06.9

## 2019-10-20 LAB — COMPREHENSIVE METABOLIC PANEL
ALT: 21 U/L (ref 0–44)
AST: 19 U/L (ref 15–41)
Albumin: 4.4 g/dL (ref 3.5–5.0)
Alkaline Phosphatase: 46 U/L (ref 38–126)
Anion gap: 11 (ref 5–15)
BUN: 7 mg/dL (ref 6–20)
CO2: 25 mmol/L (ref 22–32)
Calcium: 9.3 mg/dL (ref 8.9–10.3)
Chloride: 102 mmol/L (ref 98–111)
Creatinine, Ser: 0.79 mg/dL (ref 0.44–1.00)
GFR, Estimated: >60 mL/min (ref >60)
Glucose, Bld: 102 mg/dL — ABNORMAL HIGH (ref 70–99)
Potassium: 3.6 mmol/L (ref 3.5–5.1)
Sodium: 138 mmol/L (ref 135–145)
Total Bilirubin: 0.7 mg/dL (ref 0.3–1.2)
Total Protein: 7.9 g/dL (ref 6.5–8.1)

## 2019-10-20 LAB — CBC
HCT: 44.4 % (ref 36.0–46.0)
Hemoglobin: 15 g/dL (ref 12.0–15.0)
MCH: 30.4 pg (ref 26.0–34.0)
MCHC: 33.8 g/dL (ref 30.0–36.0)
MCV: 89.9 fL (ref 80.0–100.0)
Platelets: 311 10*3/uL (ref 150–400)
RBC: 4.94 MIL/uL (ref 3.87–5.11)
RDW: 12.1 % (ref 11.5–15.5)
WBC: 6.9 10*3/uL (ref 4.0–10.5)
nRBC: 0 % (ref 0.0–0.2)

## 2019-10-20 LAB — URINALYSIS, COMPLETE (UACMP) WITH MICROSCOPIC
Bilirubin Urine: NEGATIVE
Glucose, UA: NEGATIVE mg/dL
Hgb urine dipstick: NEGATIVE
Ketones, ur: 5 mg/dL — AB
Nitrite: NEGATIVE
Protein, ur: NEGATIVE mg/dL
Specific Gravity, Urine: 1.004 — ABNORMAL LOW (ref 1.005–1.030)
pH: 9 — ABNORMAL HIGH (ref 5.0–8.0)

## 2019-10-20 LAB — LIPASE, BLOOD: Lipase: 33 U/L (ref 11–51)

## 2019-10-20 MED ORDER — ONDANSETRON HCL 4 MG PO TABS: 4.0000 mg | ORAL_TABLET | Freq: Every day | ORAL | 0 refills | Status: AC | PRN

## 2019-10-20 MED ORDER — EPINEPHRINE 0.3 MG/0.3ML IJ SOAJ: 0.3000 mg | Freq: Once | INTRAMUSCULAR | Status: DC | PRN

## 2019-10-20 MED ORDER — ALBUTEROL SULFATE HFA 108 (90 BASE) MCG/ACT IN AERS: 2.0000 | INHALATION_SPRAY | Freq: Once | RESPIRATORY_TRACT | Status: DC | PRN

## 2019-10-20 MED ORDER — SODIUM CHLORIDE 0.9 % IV SOLN: INTRAVENOUS | Status: DC | PRN

## 2019-10-20 MED ORDER — SODIUM CHLORIDE 0.9 % IV SOLN: Freq: Once | INTRAVENOUS | Status: DC

## 2019-10-20 MED ORDER — DIPHENHYDRAMINE HCL 50 MG/ML IJ SOLN: 50.0000 mg | Freq: Once | INTRAMUSCULAR | Status: DC | PRN

## 2019-10-20 MED ORDER — SODIUM CHLORIDE 0.9 % IV SOLN: Freq: Once | INTRAVENOUS | Status: AC

## 2019-10-20 MED ORDER — ONDANSETRON HCL 4 MG/2ML IJ SOLN
4.0000 mg | Freq: Once | INTRAMUSCULAR | Status: AC
  Administered 2019-10-20: 4 mg via INTRAVENOUS
  Filled 2019-10-20: qty 2

## 2019-10-20 MED ORDER — METHYLPREDNISOLONE SODIUM SUCC 125 MG IJ SOLR: 125.0000 mg | Freq: Once | INTRAMUSCULAR | Status: DC | PRN

## 2019-10-20 MED ORDER — FAMOTIDINE IN NACL 20-0.9 MG/50ML-% IV SOLN: 20.0000 mg | Freq: Once | INTRAVENOUS | Status: DC | PRN

## 2019-10-20 NOTE — Progress Notes (Signed)
  Diagnosis: COVID-19  Physician: Dr Joya Gaskins  Procedure: [facility] Med: bamlanivimab\etesevimab infusion - Provided patient with bamlanimivab\etesevimab fact sheet for patients, parents and caregivers prior to infusion.  Complications: No immediate complications noted.  Discharge: Discharged home   Ashley Murrain 10/20/2019

## 2019-10-20 NOTE — Telephone Encounter (Signed)
Called pt and explained possible mAb treatment. She is currently at [facility] ED. Sx started on 10/11/19. Tested positive 10/01. COVID sx include " fever, sore throat, sinus drainage, upset stomach, nausea, deep cough in chest, inability to eat, and shortness of breath with movement." Hx includes a rare form of rheumatoid arthritis for which she is rx'd hydroxychloroquine. Pt interested in tx. I informed her an APP will call back to possibly schedule an appointment.

## 2019-10-20 NOTE — Discharge Instructions (Signed)

## 2019-10-20 NOTE — Progress Notes (Signed)
I connected by phone with Debra Dixon on 10/20/2019 at 12:30 PM to discuss the potential use of a new treatment for mild to moderate COVID-19 viral infection in non-hospitalized patients.  This patient is a 44 y.o. female that meets the FDA criteria for Emergency Use Authorization of COVID monoclonal antibody casirivimab/imdevimab or bamlanivimab/eteseviamb.  Has a (+) direct SARS-CoV-2 viral test result  Has mild or moderate COVID-19   Is NOT hospitalized due to COVID-19  Is within 10 days of symptom onset  Has at least one of the high risk factor(s) for progression to severe COVID-19 and/or hospitalization as defined in EUA.  Specific high risk criteria : Immunosuppressive Disease or Treatment   I have spoken and communicated the following to the patient or parent/caregiver regarding COVID monoclonal antibody treatment:  1. FDA has authorized the emergency use for the treatment of mild to moderate COVID-19 in adults and pediatric patients with positive results of direct SARS-CoV-2 viral testing who are 3 years of age and older weighing at least 40 kg, and who are at high risk for progressing to severe COVID-19 and/or hospitalization.  2. The significant known and potential risks and benefits of COVID monoclonal antibody, and the extent to which such potential risks and benefits are unknown.  3. Information on available alternative treatments and the risks and benefits of those alternatives, including clinical trials.  4. Patients treated with COVID monoclonal antibody should continue to self-isolate and use infection control measures (e.g., wear mask, isolate, social distance, avoid sharing personal items, clean and disinfect "high touch" surfaces, and frequent handwashing) according to CDC guidelines.   5. The patient or parent/caregiver has the option to accept or refuse COVID monoclonal antibody treatment.  After reviewing this information with the patient, the patient has  agreed to receive one of the available covid 19 monoclonal antibodies and will be provided an appropriate fact sheet prior to infusion.   Sx onset 10/1. Set up for infusion today 10/10. Pt Is unvaccinated.    Angelena Form, PA-C 10/20/2019 12:30 PM

## 2019-10-20 NOTE — ED Notes (Signed)
Pt states she is leaving to go get the infusion in [place].

## 2019-10-20 NOTE — ED Triage Notes (Signed)
Pt ambulatory to triage c/o abd pain, Sob with exertion. diagnosed with covid on 10/1. States she has had no appetite and unable to eat for several days, feels fatigued from not eating. NAD noted at this time. RR even and unlabored.

## 2019-10-22 ENCOUNTER — Encounter: Payer: Self-pay | Admitting: Internal Medicine

## 2019-10-25 DIAGNOSIS — Z20822 Contact with and (suspected) exposure to covid-19: Secondary | ICD-10-CM | POA: Diagnosis not present

## 2019-10-29 NOTE — Telephone Encounter (Signed)
Please Advise.  KP

## 2020-02-20 DIAGNOSIS — M1230 Palindromic rheumatism, unspecified site: Secondary | ICD-10-CM | POA: Diagnosis not present

## 2020-02-20 DIAGNOSIS — K589 Irritable bowel syndrome without diarrhea: Secondary | ICD-10-CM | POA: Diagnosis not present

## 2020-02-20 DIAGNOSIS — F419 Anxiety disorder, unspecified: Secondary | ICD-10-CM | POA: Diagnosis not present

## 2020-03-03 DIAGNOSIS — R221 Localized swelling, mass and lump, neck: Secondary | ICD-10-CM | POA: Diagnosis not present

## 2020-03-03 DIAGNOSIS — K589 Irritable bowel syndrome without diarrhea: Secondary | ICD-10-CM | POA: Diagnosis not present

## 2020-03-03 DIAGNOSIS — M1230 Palindromic rheumatism, unspecified site: Secondary | ICD-10-CM | POA: Diagnosis not present

## 2020-03-03 DIAGNOSIS — E039 Hypothyroidism, unspecified: Secondary | ICD-10-CM | POA: Diagnosis not present

## 2020-03-03 DIAGNOSIS — M791 Myalgia, unspecified site: Secondary | ICD-10-CM | POA: Diagnosis not present

## 2020-03-03 DIAGNOSIS — E7800 Pure hypercholesterolemia, unspecified: Secondary | ICD-10-CM | POA: Diagnosis not present

## 2020-03-03 DIAGNOSIS — E119 Type 2 diabetes mellitus without complications: Secondary | ICD-10-CM | POA: Diagnosis not present

## 2020-03-03 DIAGNOSIS — M2550 Pain in unspecified joint: Secondary | ICD-10-CM | POA: Diagnosis not present

## 2020-03-03 DIAGNOSIS — L719 Rosacea, unspecified: Secondary | ICD-10-CM | POA: Diagnosis not present

## 2020-03-03 DIAGNOSIS — E559 Vitamin D deficiency, unspecified: Secondary | ICD-10-CM | POA: Diagnosis not present

## 2020-03-31 ENCOUNTER — Encounter: Payer: BC Managed Care – PPO | Admitting: Internal Medicine

## 2020-04-23 DIAGNOSIS — Z1151 Encounter for screening for human papillomavirus (HPV): Secondary | ICD-10-CM | POA: Diagnosis not present

## 2020-04-23 DIAGNOSIS — Z113 Encounter for screening for infections with a predominantly sexual mode of transmission: Secondary | ICD-10-CM | POA: Diagnosis not present

## 2020-04-23 DIAGNOSIS — M79661 Pain in right lower leg: Secondary | ICD-10-CM | POA: Diagnosis not present

## 2020-04-23 DIAGNOSIS — G608 Other hereditary and idiopathic neuropathies: Secondary | ICD-10-CM | POA: Diagnosis not present

## 2020-04-23 DIAGNOSIS — Z0000 Encounter for general adult medical examination without abnormal findings: Secondary | ICD-10-CM | POA: Diagnosis not present

## 2020-04-23 DIAGNOSIS — G603 Idiopathic progressive neuropathy: Secondary | ICD-10-CM | POA: Diagnosis not present

## 2020-04-23 DIAGNOSIS — Z124 Encounter for screening for malignant neoplasm of cervix: Secondary | ICD-10-CM | POA: Diagnosis not present

## 2020-04-23 DIAGNOSIS — Z01419 Encounter for gynecological examination (general) (routine) without abnormal findings: Secondary | ICD-10-CM | POA: Diagnosis not present

## 2020-08-20 DIAGNOSIS — E559 Vitamin D deficiency, unspecified: Secondary | ICD-10-CM | POA: Diagnosis not present

## 2020-08-20 DIAGNOSIS — J4530 Mild persistent asthma, uncomplicated: Secondary | ICD-10-CM | POA: Diagnosis not present

## 2020-08-20 DIAGNOSIS — E7800 Pure hypercholesterolemia, unspecified: Secondary | ICD-10-CM | POA: Diagnosis not present

## 2020-08-20 DIAGNOSIS — E039 Hypothyroidism, unspecified: Secondary | ICD-10-CM | POA: Diagnosis not present

## 2020-08-20 DIAGNOSIS — F419 Anxiety disorder, unspecified: Secondary | ICD-10-CM | POA: Diagnosis not present

## 2020-08-20 DIAGNOSIS — Z136 Encounter for screening for cardiovascular disorders: Secondary | ICD-10-CM | POA: Diagnosis not present

## 2020-08-20 DIAGNOSIS — E282 Polycystic ovarian syndrome: Secondary | ICD-10-CM | POA: Diagnosis not present

## 2020-08-20 DIAGNOSIS — K589 Irritable bowel syndrome without diarrhea: Secondary | ICD-10-CM | POA: Diagnosis not present

## 2020-08-25 DIAGNOSIS — I6523 Occlusion and stenosis of bilateral carotid arteries: Secondary | ICD-10-CM | POA: Diagnosis not present

## 2020-08-25 DIAGNOSIS — I369 Nonrheumatic tricuspid valve disorder, unspecified: Secondary | ICD-10-CM | POA: Diagnosis not present

## 2020-08-25 DIAGNOSIS — I517 Cardiomegaly: Secondary | ICD-10-CM | POA: Diagnosis not present

## 2020-09-03 DIAGNOSIS — R454 Irritability and anger: Secondary | ICD-10-CM | POA: Diagnosis not present

## 2020-09-03 DIAGNOSIS — R61 Generalized hyperhidrosis: Secondary | ICD-10-CM | POA: Diagnosis not present

## 2020-09-03 DIAGNOSIS — F419 Anxiety disorder, unspecified: Secondary | ICD-10-CM | POA: Diagnosis not present

## 2020-09-03 DIAGNOSIS — J4530 Mild persistent asthma, uncomplicated: Secondary | ICD-10-CM | POA: Diagnosis not present

## 2020-10-15 DIAGNOSIS — J4530 Mild persistent asthma, uncomplicated: Secondary | ICD-10-CM | POA: Diagnosis not present

## 2020-10-15 DIAGNOSIS — R454 Irritability and anger: Secondary | ICD-10-CM | POA: Diagnosis not present

## 2020-10-15 DIAGNOSIS — R5383 Other fatigue: Secondary | ICD-10-CM | POA: Diagnosis not present

## 2020-10-15 DIAGNOSIS — F419 Anxiety disorder, unspecified: Secondary | ICD-10-CM | POA: Diagnosis not present

## 2020-11-24 DIAGNOSIS — E039 Hypothyroidism, unspecified: Secondary | ICD-10-CM | POA: Diagnosis not present

## 2020-11-24 DIAGNOSIS — E7800 Pure hypercholesterolemia, unspecified: Secondary | ICD-10-CM | POA: Diagnosis not present

## 2020-11-24 DIAGNOSIS — J4530 Mild persistent asthma, uncomplicated: Secondary | ICD-10-CM | POA: Diagnosis not present

## 2020-11-24 DIAGNOSIS — E559 Vitamin D deficiency, unspecified: Secondary | ICD-10-CM | POA: Diagnosis not present

## 2020-11-24 DIAGNOSIS — F419 Anxiety disorder, unspecified: Secondary | ICD-10-CM | POA: Diagnosis not present

## 2020-11-24 DIAGNOSIS — E119 Type 2 diabetes mellitus without complications: Secondary | ICD-10-CM | POA: Diagnosis not present

## 2021-06-10 DIAGNOSIS — E039 Hypothyroidism, unspecified: Secondary | ICD-10-CM | POA: Diagnosis not present

## 2021-06-10 DIAGNOSIS — F419 Anxiety disorder, unspecified: Secondary | ICD-10-CM | POA: Diagnosis not present

## 2021-06-10 DIAGNOSIS — E559 Vitamin D deficiency, unspecified: Secondary | ICD-10-CM | POA: Diagnosis not present

## 2021-06-10 DIAGNOSIS — E7800 Pure hypercholesterolemia, unspecified: Secondary | ICD-10-CM | POA: Diagnosis not present

## 2021-06-10 DIAGNOSIS — J4530 Mild persistent asthma, uncomplicated: Secondary | ICD-10-CM | POA: Diagnosis not present

## 2021-06-24 ENCOUNTER — Other Ambulatory Visit: Payer: Self-pay | Admitting: Adult Health

## 2021-06-24 DIAGNOSIS — Z1231 Encounter for screening mammogram for malignant neoplasm of breast: Secondary | ICD-10-CM

## 2021-08-26 ENCOUNTER — Ambulatory Visit
Admission: RE | Admit: 2021-08-26 | Discharge: 2021-08-26 | Disposition: A | Discharge status: Home / Self Care | Payer: BC Managed Care – PPO | Source: Ambulatory Visit | Attending: Adult Health | Admitting: Adult Health

## 2021-08-26 DIAGNOSIS — Z1231 Encounter for screening mammogram for malignant neoplasm of breast: Secondary | ICD-10-CM | POA: Diagnosis not present

## 2021-11-15 DIAGNOSIS — J4530 Mild persistent asthma, uncomplicated: Secondary | ICD-10-CM | POA: Diagnosis not present

## 2021-11-15 DIAGNOSIS — K589 Irritable bowel syndrome without diarrhea: Secondary | ICD-10-CM | POA: Diagnosis not present

## 2021-11-15 DIAGNOSIS — E7800 Pure hypercholesterolemia, unspecified: Secondary | ICD-10-CM | POA: Diagnosis not present

## 2021-11-15 DIAGNOSIS — E039 Hypothyroidism, unspecified: Secondary | ICD-10-CM | POA: Diagnosis not present

## 2021-11-15 DIAGNOSIS — E559 Vitamin D deficiency, unspecified: Secondary | ICD-10-CM | POA: Diagnosis not present

## 2021-11-15 DIAGNOSIS — R5383 Other fatigue: Secondary | ICD-10-CM | POA: Diagnosis not present

## 2021-11-15 DIAGNOSIS — R5381 Other malaise: Secondary | ICD-10-CM | POA: Diagnosis not present

## 2021-11-15 DIAGNOSIS — H9201 Otalgia, right ear: Secondary | ICD-10-CM | POA: Diagnosis not present

## 2021-11-15 DIAGNOSIS — J019 Acute sinusitis, unspecified: Secondary | ICD-10-CM | POA: Diagnosis not present

## 2021-11-30 DIAGNOSIS — M25571 Pain in right ankle and joints of right foot: Secondary | ICD-10-CM | POA: Diagnosis not present

## 2021-11-30 DIAGNOSIS — K589 Irritable bowel syndrome without diarrhea: Secondary | ICD-10-CM | POA: Diagnosis not present

## 2021-11-30 DIAGNOSIS — M109 Gout, unspecified: Secondary | ICD-10-CM | POA: Diagnosis not present

## 2021-11-30 DIAGNOSIS — I6523 Occlusion and stenosis of bilateral carotid arteries: Secondary | ICD-10-CM | POA: Diagnosis not present

## 2021-11-30 DIAGNOSIS — M1230 Palindromic rheumatism, unspecified site: Secondary | ICD-10-CM | POA: Diagnosis not present

## 2021-11-30 DIAGNOSIS — M1A0710 Idiopathic chronic gout, right ankle and foot, without tophus (tophi): Secondary | ICD-10-CM | POA: Diagnosis not present

## 2021-11-30 DIAGNOSIS — I369 Nonrheumatic tricuspid valve disorder, unspecified: Secondary | ICD-10-CM | POA: Diagnosis not present

## 2021-11-30 DIAGNOSIS — E7800 Pure hypercholesterolemia, unspecified: Secondary | ICD-10-CM | POA: Diagnosis not present

## 2022-01-14 DIAGNOSIS — M25571 Pain in right ankle and joints of right foot: Secondary | ICD-10-CM | POA: Diagnosis not present

## 2022-02-11 DIAGNOSIS — M25571 Pain in right ankle and joints of right foot: Secondary | ICD-10-CM | POA: Diagnosis not present

## 2022-02-14 DIAGNOSIS — F419 Anxiety disorder, unspecified: Secondary | ICD-10-CM | POA: Diagnosis not present

## 2022-02-14 DIAGNOSIS — E785 Hyperlipidemia, unspecified: Secondary | ICD-10-CM | POA: Diagnosis not present

## 2022-02-14 DIAGNOSIS — R5383 Other fatigue: Secondary | ICD-10-CM | POA: Diagnosis not present

## 2022-02-14 DIAGNOSIS — Z79899 Other long term (current) drug therapy: Secondary | ICD-10-CM | POA: Diagnosis not present

## 2022-02-14 DIAGNOSIS — E7800 Pure hypercholesterolemia, unspecified: Secondary | ICD-10-CM | POA: Diagnosis not present

## 2022-02-14 DIAGNOSIS — E559 Vitamin D deficiency, unspecified: Secondary | ICD-10-CM | POA: Diagnosis not present

## 2022-02-14 DIAGNOSIS — K589 Irritable bowel syndrome without diarrhea: Secondary | ICD-10-CM | POA: Diagnosis not present

## 2022-02-14 DIAGNOSIS — J4530 Mild persistent asthma, uncomplicated: Secondary | ICD-10-CM | POA: Diagnosis not present

## 2022-02-14 DIAGNOSIS — E039 Hypothyroidism, unspecified: Secondary | ICD-10-CM | POA: Diagnosis not present

## 2022-02-14 DIAGNOSIS — R5381 Other malaise: Secondary | ICD-10-CM | POA: Diagnosis not present

## 2022-02-17 LAB — LAB REPORT - SCANNED
Creatinine, POC: 25 mg/dL
EGFR: 107

## 2022-02-22 DIAGNOSIS — M25571 Pain in right ankle and joints of right foot: Secondary | ICD-10-CM | POA: Diagnosis not present

## 2022-02-28 DIAGNOSIS — M13871 Other specified arthritis, right ankle and foot: Secondary | ICD-10-CM | POA: Diagnosis not present

## 2022-03-10 DIAGNOSIS — M25571 Pain in right ankle and joints of right foot: Secondary | ICD-10-CM | POA: Diagnosis not present

## 2022-03-10 DIAGNOSIS — M05741 Rheumatoid arthritis with rheumatoid factor of right hand without organ or systems involvement: Secondary | ICD-10-CM | POA: Diagnosis not present

## 2022-03-10 DIAGNOSIS — M7671 Peroneal tendinitis, right leg: Secondary | ICD-10-CM | POA: Diagnosis not present

## 2022-03-10 DIAGNOSIS — M05742 Rheumatoid arthritis with rheumatoid factor of left hand without organ or systems involvement: Secondary | ICD-10-CM | POA: Diagnosis not present

## 2022-03-10 DIAGNOSIS — M79671 Pain in right foot: Secondary | ICD-10-CM | POA: Diagnosis not present

## 2022-04-07 DIAGNOSIS — M05742 Rheumatoid arthritis with rheumatoid factor of left hand without organ or systems involvement: Secondary | ICD-10-CM | POA: Diagnosis not present

## 2022-04-07 DIAGNOSIS — M7671 Peroneal tendinitis, right leg: Secondary | ICD-10-CM | POA: Diagnosis not present

## 2022-04-07 DIAGNOSIS — M05741 Rheumatoid arthritis with rheumatoid factor of right hand without organ or systems involvement: Secondary | ICD-10-CM | POA: Diagnosis not present

## 2022-04-07 DIAGNOSIS — M25571 Pain in right ankle and joints of right foot: Secondary | ICD-10-CM | POA: Diagnosis not present

## 2022-05-12 DIAGNOSIS — M25571 Pain in right ankle and joints of right foot: Secondary | ICD-10-CM | POA: Diagnosis not present

## 2022-05-17 DIAGNOSIS — M05742 Rheumatoid arthritis with rheumatoid factor of left hand without organ or systems involvement: Secondary | ICD-10-CM | POA: Diagnosis not present

## 2022-05-17 DIAGNOSIS — M7671 Peroneal tendinitis, right leg: Secondary | ICD-10-CM | POA: Diagnosis not present

## 2022-05-17 DIAGNOSIS — M25571 Pain in right ankle and joints of right foot: Secondary | ICD-10-CM | POA: Diagnosis not present

## 2022-05-17 DIAGNOSIS — M05741 Rheumatoid arthritis with rheumatoid factor of right hand without organ or systems involvement: Secondary | ICD-10-CM | POA: Diagnosis not present

## 2022-05-19 DIAGNOSIS — M25571 Pain in right ankle and joints of right foot: Secondary | ICD-10-CM | POA: Diagnosis not present

## 2022-05-24 DIAGNOSIS — M25571 Pain in right ankle and joints of right foot: Secondary | ICD-10-CM | POA: Diagnosis not present

## 2022-05-26 DIAGNOSIS — M25571 Pain in right ankle and joints of right foot: Secondary | ICD-10-CM | POA: Diagnosis not present

## 2022-05-31 DIAGNOSIS — M25571 Pain in right ankle and joints of right foot: Secondary | ICD-10-CM | POA: Diagnosis not present

## 2022-06-07 DIAGNOSIS — M25571 Pain in right ankle and joints of right foot: Secondary | ICD-10-CM | POA: Diagnosis not present

## 2022-06-09 DIAGNOSIS — M25571 Pain in right ankle and joints of right foot: Secondary | ICD-10-CM | POA: Diagnosis not present

## 2022-06-14 DIAGNOSIS — M25571 Pain in right ankle and joints of right foot: Secondary | ICD-10-CM | POA: Diagnosis not present

## 2022-06-16 DIAGNOSIS — M25571 Pain in right ankle and joints of right foot: Secondary | ICD-10-CM | POA: Diagnosis not present

## 2022-06-21 DIAGNOSIS — M25571 Pain in right ankle and joints of right foot: Secondary | ICD-10-CM | POA: Diagnosis not present

## 2022-06-28 DIAGNOSIS — M25571 Pain in right ankle and joints of right foot: Secondary | ICD-10-CM | POA: Diagnosis not present

## 2022-07-05 DIAGNOSIS — M25571 Pain in right ankle and joints of right foot: Secondary | ICD-10-CM | POA: Diagnosis not present

## 2022-07-05 DIAGNOSIS — J309 Allergic rhinitis, unspecified: Secondary | ICD-10-CM | POA: Diagnosis not present

## 2022-07-05 DIAGNOSIS — E7800 Pure hypercholesterolemia, unspecified: Secondary | ICD-10-CM | POA: Diagnosis not present

## 2022-07-05 DIAGNOSIS — E559 Vitamin D deficiency, unspecified: Secondary | ICD-10-CM | POA: Diagnosis not present

## 2022-07-05 DIAGNOSIS — K589 Irritable bowel syndrome without diarrhea: Secondary | ICD-10-CM | POA: Diagnosis not present

## 2022-07-05 DIAGNOSIS — E785 Hyperlipidemia, unspecified: Secondary | ICD-10-CM | POA: Diagnosis not present

## 2022-07-05 DIAGNOSIS — E538 Deficiency of other specified B group vitamins: Secondary | ICD-10-CM | POA: Diagnosis not present

## 2022-07-05 DIAGNOSIS — D519 Vitamin B12 deficiency anemia, unspecified: Secondary | ICD-10-CM | POA: Diagnosis not present

## 2022-07-05 DIAGNOSIS — R799 Abnormal finding of blood chemistry, unspecified: Secondary | ICD-10-CM | POA: Diagnosis not present

## 2022-07-05 DIAGNOSIS — R5383 Other fatigue: Secondary | ICD-10-CM | POA: Diagnosis not present

## 2022-07-05 DIAGNOSIS — E039 Hypothyroidism, unspecified: Secondary | ICD-10-CM | POA: Diagnosis not present

## 2022-07-08 LAB — LAB REPORT - SCANNED: EGFR: 91

## 2022-07-19 DIAGNOSIS — M25571 Pain in right ankle and joints of right foot: Secondary | ICD-10-CM | POA: Diagnosis not present

## 2022-07-20 ENCOUNTER — Telehealth: Payer: Self-pay | Admitting: Gastroenterology

## 2022-07-20 NOTE — Telephone Encounter (Signed)
Patient PCP nurse Lyla Son) called in to see when the patient had her last colonoscopy. I advised her that the patient last colonoscopy. She stated that the patient advised them that she has a history of polyps. The patient advised them that she was informed that she had polyps, and they would like to know if they need to send over a referral for another colonoscopy. Due to it been 5 years they would like to get it done again to remove the polyps.

## 2022-07-21 NOTE — Telephone Encounter (Signed)
Debra Dixon is aware that pt will just need to call us to schedule colonoscopy as she is past due

## 2022-10-03 ENCOUNTER — Other Ambulatory Visit: Payer: Self-pay | Admitting: Family Medicine

## 2022-10-03 DIAGNOSIS — Z1231 Encounter for screening mammogram for malignant neoplasm of breast: Secondary | ICD-10-CM

## 2022-10-14 ENCOUNTER — Ambulatory Visit
Admission: RE | Admit: 2022-10-14 | Discharge: 2022-10-14 | Disposition: A | Discharge status: Home / Self Care | Payer: BC Managed Care – PPO | Source: Ambulatory Visit | Attending: Family Medicine | Admitting: Family Medicine

## 2022-10-14 DIAGNOSIS — Z1231 Encounter for screening mammogram for malignant neoplasm of breast: Secondary | ICD-10-CM | POA: Insufficient documentation

## 2022-11-01 ENCOUNTER — Ambulatory Visit: Payer: BC Managed Care – PPO | Admitting: Family Medicine

## 2022-11-15 ENCOUNTER — Ambulatory Visit: Payer: BC Managed Care – PPO | Admitting: Family Medicine

## 2022-11-25 ENCOUNTER — Other Ambulatory Visit: Payer: Self-pay

## 2022-11-30 ENCOUNTER — Ambulatory Visit: Payer: BC Managed Care – PPO | Admitting: Gastroenterology

## 2023-01-12 ENCOUNTER — Ambulatory Visit: Payer: BC Managed Care – PPO | Admitting: Family Medicine

## 2023-01-12 ENCOUNTER — Encounter: Payer: Self-pay | Admitting: Family Medicine

## 2023-01-12 VITALS — BP 124/64 | HR 89 | Resp 18 | Ht 64.0 in | Wt 127.4 lb

## 2023-01-12 DIAGNOSIS — E049 Nontoxic goiter, unspecified: Secondary | ICD-10-CM

## 2023-01-12 DIAGNOSIS — F410 Panic disorder [episodic paroxysmal anxiety] without agoraphobia: Secondary | ICD-10-CM | POA: Diagnosis not present

## 2023-01-12 DIAGNOSIS — M0579 Rheumatoid arthritis with rheumatoid factor of multiple sites without organ or systems involvement: Secondary | ICD-10-CM | POA: Diagnosis not present

## 2023-01-12 DIAGNOSIS — J4520 Mild intermittent asthma, uncomplicated: Secondary | ICD-10-CM

## 2023-01-12 DIAGNOSIS — E010 Iodine-deficiency related diffuse (endemic) goiter: Secondary | ICD-10-CM

## 2023-01-12 DIAGNOSIS — L989 Disorder of the skin and subcutaneous tissue, unspecified: Secondary | ICD-10-CM | POA: Diagnosis not present

## 2023-01-12 DIAGNOSIS — R0981 Nasal congestion: Secondary | ICD-10-CM

## 2023-01-12 DIAGNOSIS — J45909 Unspecified asthma, uncomplicated: Secondary | ICD-10-CM | POA: Insufficient documentation

## 2023-01-12 NOTE — Patient Instructions (Signed)
 [email or web address]

## 2023-01-12 NOTE — Assessment & Plan Note (Signed)
 New brown spot on leg for 6 months. No family history of skin cancer. Differential includes seborrheic keratosis versus other skin lesions. Recommend biopsy due to color change. - Refer to dermatologist for biopsy

## 2023-01-12 NOTE — Assessment & Plan Note (Signed)
 Intermittent symptoms likely secondary to chronic allergies. Uses albuterol 108 mcg as needed. Discontinued Symbicort due to adverse taste. Chronic, stable  - Continue albuterol 108 mcg as needed - Discontinue Symbicort per patient preference

## 2023-01-12 NOTE — Assessment & Plan Note (Signed)
 Managed with hydroxychloroquine  200 mg twice daily. Well-controlled under current management. No need for rheumatology follow-up unless condition changes. - Continue hydroxychloroquine  200 mg twice daily -continue to follow up as scheduled with rheumatology

## 2023-01-12 NOTE — Assessment & Plan Note (Signed)
 Chronic seasonal allergies managed with Flonase 50 mcg, one spray into both nostrils daily. - Continue Flonase 50 mcg, one spray into both nostrils daily

## 2023-01-12 NOTE — Assessment & Plan Note (Signed)
 Noted on exam  Will request previous records from prior provider's office

## 2023-01-12 NOTE — Progress Notes (Signed)
 New patient visit   Patient: Debra Dixon   DOB: 1975/11/08   48 y.o. Female  MRN: [id number] Visit Date: 01/12/2023  Today's healthcare provider: Rockie Agent, MD   Chief Complaint  Patient presents with   Establish Care   Subjective    Debra Dixon is a 48 y.o. female who presents today as a new patient to establish care.      Discussed the use of AI scribe software for clinical note transcription with the patient, who gave verbal consent to proceed.  History of Present Illness   The patient is a 48 year old individual establishing care as a new patient. She has a past medical history of anxiety, palpitations, gastric reflux, rheumatoid arthritis, and seasonal allergies. The patient's anxiety is managed with lorazepam  0.5 mg twice daily as needed. Her rheumatoid arthritis, a chronic condition, is managed with Plaquenil  200 mg twice daily. She also uses Flonase 50 mcg, one spray into both nostrils daily, and nicotine  lozenges 4 mg as needed.  The patient has a surgical history of appendectomy in 1985, knee arthroscopy in 1988, LEAP procedure in 2010, and tubal ligation in 2008. She also had a colonoscopy and EGD in 2016.  The patient has a family history of diabetes, BRCA1/2 positivity in her mother, breast cancer in a maternal aunt and sister, and colon cancer in a paternal grandfather.  The patient has been experiencing increased anxiety due to her husband's probable stage 4 cancer diagnosis. She has been managing her anxiety with lorazepam  as needed, but reports an increase in usage to about two pills per week due to the stress of her husband's illness and the associated medical appointments.  The patient also reports a new brown spot on her leg that appeared about six months ago. She has a history of tanning, which she stopped about a year ago. The spot has not changed in size or color since it first appeared. The patient has no personal or family history of  skin cancer.  The patient has been managing her rheumatoid arthritis with hydroxychloroquine , and reports that the condition has been stable. She also reports chronic allergy issues, which sometimes cause asthmatic problems. She uses an albuterol  inhaler as needed for these episodes.  The patient has a history of smoking but quit eight years ago. She continues to use nicotine  lozenges, which she purchases through [employer]. She expresses some shame about her continued use of nicotine , but fears that trying to quit the lozenges might lead her to start smoking again.  The patient also reports having irritable bowel syndrome, which can exacerbate her anxiety when she becomes constipated. She has a fear of taking medications due to past experiences with medications for anxiety that made her feel like a zombie or worsened her anxiety.           01/12/2023    9:11 AM  GAD 7 : Generalized Anxiety Score  Nervous, Anxious, on Edge 3  Control/stop worrying 3  Worry too much - different things 3  Trouble relaxing 3  Restless 3  Easily annoyed or irritable 3  Afraid - awful might happen 3  Total GAD 7 Score 21      Past Medical History:  Diagnosis Date   Anxiety    no meds   Dysrhythmia    Palpatations - echo done 5 yrs ago in [place], no issues.   GERD (gastroesophageal reflux disease)    Headache    caused by cigarette allergy  Heart murmur    as child   Rheumatoid arthritis (HCC)    Seasonal allergies     Outpatient Medications Prior to Visit  Medication Sig   albuterol  (VENTOLIN  HFA) 108 (90 Base) MCG/ACT inhaler Inhale 2 puffs into the lungs every 4 (four) hours as needed for shortness of breath or wheezing.   hydroxychloroquine  (PLAQUENIL ) 200 MG tablet Take 200 mg by mouth 2 (two) times daily. Dr. Alfredo Rivandeneira   LORazepam  (ATIVAN ) 0.5 MG tablet Take 1 tablet by mouth 2 (two) times daily as needed.   nicotine  polacrilex (COMMIT) 4 MG lozenge Take 4 mg by mouth as needed.    [DISCONTINUED] budesonide-formoterol (SYMBICORT) 160-4.5 MCG/ACT inhaler Inhale 2 puffs into the lungs 2 (two) times daily.   [DISCONTINUED] fluticasone (FLONASE) 50 MCG/ACT nasal spray Place 1 spray into both nostrils daily. AM   No facility-administered medications prior to visit.    Past Surgical History:  Procedure Laterality Date   APPENDECTOMY  1985   COLONOSCOPY N/A 05/22/2014   Procedure: COLONOSCOPY;  Surgeon: Rogelia Copping, MD;  Location: [facility];  Service: Gastroenterology;  Laterality: N/A;  cecum time- 9074    ESOPHAGOGASTRODUODENOSCOPY N/A 05/22/2014   Procedure: ESOPHAGOGASTRODUODENOSCOPY (EGD);  Surgeon: Rogelia Copping, MD;  Location: [facility];  Service: Gastroenterology;  Laterality: N/A;   ESOPHAGOGASTRODUODENOSCOPY  05/22/2014   KNEE ARTHROSCOPY Left 1988   LEEP  2010   TUBAL LIGATION  2008   Family Status  Relation Name Status   Mother  Alive   MGM  (Not Specified)   Mat Aunt  (Not Specified)   PGF  (Not Specified)   Father  Deceased   Sister  (Not Specified)  No partnership data on file   Family History  Problem Relation Age of Onset   Diabetes Mother    BRCA 1/2 Mother    Diabetes Maternal Grandmother    Breast cancer Maternal Aunt    Colon cancer Paternal Grandfather    Breast cancer Sister    Social History   Socioeconomic History   Marital status: Married    Spouse name: Not on file   Number of children: Not on file   Years of education: Not on file   Highest education level: Not on file  Occupational History   Not on file  Tobacco Use   Smoking status: Former    Current packs/day: 0.00    Average packs/day: 2.0 packs/day for 25.0 years (50.0 ttl pk-yrs)    Types: Cigarettes    Start date: 05/04/1989    Quit date: 05/05/2014    Years since quitting: 8.6   Smokeless tobacco: Never   Tobacco comments:    Nicotine  Tabs  Vaping Use   Vaping status: Never Used  Substance and Sexual Activity   Alcohol use: No     Alcohol/week: 0.0 standard drinks of alcohol    Comment: 1- 2 drinks per year   Drug use: No   Sexual activity: Not on file  Other Topics Concern   Not on file  Social History Narrative   Not on file   Social Drivers of Health   Financial Resource Strain: Not on file  Food Insecurity: Not on file  Transportation Needs: Not on file  Physical Activity: Not on file  Stress: Not on file  Social Connections: Not on file     Allergies  Allergen Reactions   Strawberry Extract Rash     There is no immunization history on file for this patient.  Health  Maintenance  Topic Date Due   HIV Screening  Never done   Hepatitis C Screening  Never done   DTaP/Tdap/Td (1 - Tdap) Never done   INFLUENZA VACCINE  Never done   COVID-19 Vaccine (1 - 2024-25 season) Never done   Cervical Cancer Screening (HPV/Pap Cotest)  03/23/2023   Colonoscopy  05/21/2024   Pneumococcal Vaccine 31-53 Years old  Aged Out   HPV VACCINES  Aged Out    Patient Care Team: Sharma Coyer, MD as PCP - General (Family Medicine) Thais Real BROCKS, MD (Rheumatology)  Review of Systems  Last CBC Lab Results  Component Value Date   WBC 6.9 10/20/2019   HGB 15.0 10/20/2019   HCT 44.4 10/20/2019   MCV 89.9 10/20/2019   MCH 30.4 10/20/2019   RDW 12.1 10/20/2019   PLT 311 10/20/2019   Last metabolic panel Lab Results  Component Value Date   GLUCOSE 102 (H) 10/20/2019   NA 138 10/20/2019   K 3.6 10/20/2019   CL 102 10/20/2019   CO2 25 10/20/2019   BUN 7 10/20/2019   CREATININE 0.79 10/20/2019   GFRNONAA >60 10/20/2019   CALCIUM 9.3 10/20/2019   PROT 7.9 10/20/2019   ALBUMIN 4.4 10/20/2019   LABGLOB 2.3 03/29/2019   AGRATIO 2.1 03/29/2019   BILITOT 0.7 10/20/2019   ALKPHOS 46 10/20/2019   AST 19 10/20/2019   ALT 21 10/20/2019   ANIONGAP 11 10/20/2019   Last lipids Lab Results  Component Value Date   CHOL 177 03/29/2019   HDL 62 03/29/2019   LDLCALC 103 (H) 03/29/2019   TRIG  60 03/29/2019   CHOLHDL 2.9 03/29/2019   Last hemoglobin A1c No results found for: HGBA1C Last thyroid  functions Lab Results  Component Value Date   TSH 1.370 03/29/2019        Objective    BP 124/64   Pulse 89   Resp 18   Ht 5' 4 (1.626 m)   Wt 127 lb 6.4 oz (57.8 kg)   SpO2 100%   BMI 21.87 kg/m  BP Readings from Last 3 Encounters:  01/12/23 124/64  10/20/19 (!) 105/57  10/20/19 (!) 108/55   Wt Readings from Last 3 Encounters:  01/12/23 127 lb 6.4 oz (57.8 kg)  10/20/19 120 lb (54.4 kg)  03/29/19 129 lb (58.5 kg)        Depression Screen    01/12/2023    9:11 AM 03/29/2019    8:03 AM 03/23/2018    8:30 AM  PHQ 2/9 Scores  PHQ - 2 Score 0 0 0  PHQ- 9 Score 0 2    No results found for any visits on 01/12/23.   Physical Exam Physical Exam   SKIN: Lesion on leg noted as a brown spot for approximately 6 months, with no change in size but darker on one edge. Variation in pigmentation observed, suggestive of seborrheic keratosis but warrants biopsy for further evaluation.     General: Alert, no acute distress Neck: enlarged thyroid , no nodules palpated  Cardio: Normal S1 and S2, RRR, no r/m/g Pulm: CTAB, normal work of breathing Abdomen: Bowel sounds normal. Abdomen soft and non-tender.  Extremities: No peripheral edema.      Assessment & Plan      Problem List Items Addressed This Visit       Respiratory   Reactive airway disease   Intermittent symptoms likely secondary to chronic allergies. Uses albuterol  108 mcg as needed. Discontinued Symbicort due to adverse taste. Chronic, stable  -  Continue albuterol  108 mcg as needed - Discontinue Symbicort per patient preference       Congestion of nasal sinus   Chronic seasonal allergies managed with Flonase 50 mcg, one spray into both nostrils daily. - Continue Flonase 50 mcg, one spray into both nostrils daily        Endocrine   Thyromegaly   Noted on exam  Will request previous records from  prior provider's office         Musculoskeletal and Integument   Skin lesion   New brown spot on leg for 6 months. No family history of skin cancer. Differential includes seborrheic keratosis versus other skin lesions. Recommend biopsy due to color change. - Refer to dermatologist for biopsy       Relevant Orders   Ambulatory referral to Dermatology   Rheumatoid arthritis [facility])   Managed with hydroxychloroquine  200 mg twice daily. Well-controlled under current management. No need for rheumatology follow-up unless condition changes. - Continue hydroxychloroquine  200 mg twice daily -continue to follow up as scheduled with rheumatology        Other   Anxiety disorder - Primary   Chronic anxiety exacerbated by husband's probable stage IV cancer diagnosis. GAD-7 score of 21. Prefers to avoid daily medications, using lorazepam  0.5 mg twice daily as needed, recently increased to 2 pills per week. Discussed counseling benefits during husband's treatment. Prefers not to pursue additional medications due to past negative experiences and medication phobia. - Continue lorazepam  0.5 mg twice daily as needed - Schedule follow-up in 3 months - Provide information on [email or web address] for finding a therapist      Other Visit Diagnoses       Enlarged thyroid                  Nicotine  Dependence Former smoker using nicotine  lozenges 4 mg as needed for 8 years. Concerned about relapse if discontinuing lozenges. - Continue nicotine  lozenges 4 mg as needed - Encourage continued abstinence from smoking  General Health Maintenance Routine health maintenance up to date. Last colonoscopy and EGD in 2016. Last mammogram with tomosynthesis in 2024 showed no malignancy. Recommended follow-up mammogram in October 2025. - Follow-up mammogram in October 2025      Return in about 2 months (around 03/12/2023) for CPE.      Rockie Agent, MD  [facility] [phone] (phone) [phone] (fax)  [facility]

## 2023-01-12 NOTE — Assessment & Plan Note (Signed)
 Chronic anxiety exacerbated by husband's probable stage IV cancer diagnosis. GAD-7 score of 21. Prefers to avoid daily medications, using lorazepam  0.5 mg twice daily as needed, recently increased to 2 pills per week. Discussed counseling benefits during husband's treatment. Prefers not to pursue additional medications due to past negative experiences and medication phobia. - Continue lorazepam  0.5 mg twice daily as needed - Schedule follow-up in 3 months - Provide information on [email or web address] for finding a therapist

## 2023-01-23 ENCOUNTER — Encounter: Payer: Self-pay | Admitting: Family Medicine

## 2023-01-30 DIAGNOSIS — D2271 Melanocytic nevi of right lower limb, including hip: Secondary | ICD-10-CM | POA: Diagnosis not present

## 2023-01-30 DIAGNOSIS — L821 Other seborrheic keratosis: Secondary | ICD-10-CM | POA: Diagnosis not present

## 2023-01-30 DIAGNOSIS — L700 Acne vulgaris: Secondary | ICD-10-CM | POA: Diagnosis not present

## 2023-03-14 ENCOUNTER — Other Ambulatory Visit (HOSPITAL_COMMUNITY): Payer: Self-pay

## 2023-03-14 ENCOUNTER — Ambulatory Visit (INDEPENDENT_AMBULATORY_CARE_PROVIDER_SITE_OTHER): Payer: BC Managed Care – PPO | Admitting: Family Medicine

## 2023-03-14 ENCOUNTER — Encounter: Payer: Self-pay | Admitting: Family Medicine

## 2023-03-14 ENCOUNTER — Other Ambulatory Visit (HOSPITAL_COMMUNITY)
Admission: RE | Admit: 2023-03-14 | Discharge: 2023-03-14 | Disposition: A | Discharge status: Home / Self Care | Source: Ambulatory Visit | Attending: Family Medicine | Admitting: Family Medicine

## 2023-03-14 ENCOUNTER — Telehealth: Payer: Self-pay

## 2023-03-14 VITALS — BP 109/52 | HR 85 | Ht 64.0 in | Wt 122.0 lb

## 2023-03-14 DIAGNOSIS — E010 Iodine-deficiency related diffuse (endemic) goiter: Secondary | ICD-10-CM

## 2023-03-14 DIAGNOSIS — Z0001 Encounter for general adult medical examination with abnormal findings: Secondary | ICD-10-CM

## 2023-03-14 DIAGNOSIS — Z13220 Encounter for screening for lipoid disorders: Secondary | ICD-10-CM | POA: Diagnosis not present

## 2023-03-14 DIAGNOSIS — Z131 Encounter for screening for diabetes mellitus: Secondary | ICD-10-CM | POA: Diagnosis not present

## 2023-03-14 DIAGNOSIS — F410 Panic disorder [episodic paroxysmal anxiety] without agoraphobia: Secondary | ICD-10-CM

## 2023-03-14 DIAGNOSIS — Z124 Encounter for screening for malignant neoplasm of cervix: Secondary | ICD-10-CM

## 2023-03-14 DIAGNOSIS — K581 Irritable bowel syndrome with constipation: Secondary | ICD-10-CM

## 2023-03-14 DIAGNOSIS — Z1159 Encounter for screening for other viral diseases: Secondary | ICD-10-CM

## 2023-03-14 DIAGNOSIS — Z114 Encounter for screening for human immunodeficiency virus [HIV]: Secondary | ICD-10-CM | POA: Diagnosis not present

## 2023-03-14 DIAGNOSIS — M0579 Rheumatoid arthritis with rheumatoid factor of multiple sites without organ or systems involvement: Secondary | ICD-10-CM

## 2023-03-14 DIAGNOSIS — F17200 Nicotine dependence, unspecified, uncomplicated: Secondary | ICD-10-CM

## 2023-03-14 DIAGNOSIS — Z130 Encounter for screening for diseases of the blood and blood-forming organs and certain disorders involving the immune mechanism: Secondary | ICD-10-CM

## 2023-03-14 DIAGNOSIS — Z0000 Encounter for general adult medical examination without abnormal findings: Secondary | ICD-10-CM | POA: Insufficient documentation

## 2023-03-14 MED ORDER — LUBIPROSTONE 8 MCG PO CAPS: 8.0000 ug | ORAL_CAPSULE | Freq: Two times a day (BID) | ORAL | 6 refills | Status: AC

## 2023-03-14 MED ORDER — ALBUTEROL SULFATE HFA 108 (90 BASE) MCG/ACT IN AERS: 2.0000 | INHALATION_SPRAY | RESPIRATORY_TRACT | 6 refills | Status: AC | PRN

## 2023-03-14 MED ORDER — HYDROXYCHLOROQUINE SULFATE 200 MG PO TABS: 200.0000 mg | ORAL_TABLET | Freq: Two times a day (BID) | ORAL | 1 refills | Status: DC

## 2023-03-14 MED ORDER — LORAZEPAM 0.5 MG PO TABS: 0.5000 mg | ORAL_TABLET | Freq: Two times a day (BID) | ORAL | 0 refills | Status: DC | PRN

## 2023-03-14 NOTE — Progress Notes (Signed)
 Complete physical exam   Patient: Debra Dixon   DOB: Jan 26, 1975   48 y.o. Female  MRN: [id number] Visit Date: 03/14/2023  Today's healthcare provider: Ronnald Ramp, MD   Chief Complaint  Patient presents with   Annual Exam    No concerns    Health Maintenance    Denied all vaccines    Subjective    Debra Dixon is a 48 y.o. female who presents today for a complete physical exam.   She reports consuming a general diet.   The patient has a physically strenuous job, but has no regular exercise apart from work.     She does not have additional problems to discuss today.   Discussed the use of AI scribe software for clinical note transcription with the patient, who gave verbal consent to proceed.  History of Present Illness   Debra Dixon "Debra Dixon" is a 48 year old female who presents for an annual physical exam.  She is currently taking Plaquenil 200 mg twice daily for rheumatoid arthritis, Albuterol as needed, and Ativan 0.5 mg twice daily as needed. She also uses nicotine lozenges 4 mg as needed for smoking cessation, which she recently purchased on her own and does not require a refill.  She has a history of thyromegaly, although only one doctor has previously noted this, while others have not.  She experiences flare-ups of her rheumatoid arthritis, particularly affecting her grip strength and ankle.  She has a history of irritable bowel syndrome with constipation. She uses Benefiber to manage her symptoms, although it takes several days to be effective. She has previously tried Amitiza, which was effective, but her insurance did not cover it. Her constipation worsens before her menstrual period, followed by diarrhea.  She experiences stress and anxiety due to her husband's recent diagnosis of lung cancer.  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 She  describes her diet as general, without following any specific dietary plan, and she tries to watch her portions and include vegetables, fruits, and protein.         Past Medical History:  Diagnosis Date   Anxiety    no meds   Dysrhythmia    Palpatations - echo done 5 yrs ago in [place], no issues.   GERD (gastroesophageal reflux disease)    Headache    caused by cigarette allergy   Heart murmur    as child   Rheumatoid arthritis (HCC)    Seasonal allergies    Past Surgical History:  Procedure Laterality Date   APPENDECTOMY  1985   COLONOSCOPY N/A 05/22/2014   Procedure: COLONOSCOPY;  Surgeon: Debra Minium, MD;  Location: [facility];  Service: Gastroenterology;  Laterality: N/A;  cecum time- [id number]    ESOPHAGOGASTRODUODENOSCOPY N/A 05/22/2014   Procedure: ESOPHAGOGASTRODUODENOSCOPY (EGD);  Surgeon: Debra Minium, MD;  Location: [facility];  Service: Gastroenterology;  Laterality: N/A;   ESOPHAGOGASTRODUODENOSCOPY  05/22/2014   KNEE ARTHROSCOPY Left 1988   LEEP  2010   TUBAL LIGATION  2008   Social History   Socioeconomic History   Marital status: Married    Spouse name: Not on file   Number of children: Not on file   Years of education: Not on file   Highest education level: Not on file  Occupational History   Not on file  Tobacco Use   Smoking status: Former  Current packs/day: 0.00    Average packs/day: 2.0 packs/day for 25.0 years (50.0 ttl pk-yrs)    Types: Cigarettes    Start date: 05/04/1989    Quit date: 05/05/2014    Years since quitting: 8.8   Smokeless tobacco: Never   Tobacco comments:    Nicotine Tabs  Vaping Use   Vaping status: Never Used  Substance and Sexual Activity   Alcohol use: No    Alcohol/week: 0.0 standard drinks of alcohol    Comment: 1- 2 drinks per year   Drug use: No   Sexual activity: Not on file  Other Topics Concern   Not on file  Social History Narrative   Not on file   Social Drivers of Health   Financial  Resource Strain: Low Risk  (03/14/2023)   Overall Financial Resource Strain (CARDIA)    Difficulty of Paying Living Expenses: Not hard at all  Food Insecurity: No Food Insecurity (03/14/2023)   Hunger Vital Sign    Worried About Running Out of Food in the Last Year: Never true    Ran Out of Food in the Last Year: Never true  Transportation Needs: No Transportation Needs (03/14/2023)   PRAPARE - Administrator, Civil Service (Medical): No    Lack of Transportation (Non-Medical): No  Physical Activity: Not on file  Stress: No Stress Concern Present (03/14/2023)   [employer] of Occupational Health - Occupational Stress Questionnaire    Feeling of Stress : Only a little  Social Connections: Not on file  Intimate Partner Violence: Not At Risk (03/14/2023)   Humiliation, Afraid, Rape, and Kick questionnaire    Fear of Current or Ex-Partner: No    Emotionally Abused: No    Physically Abused: No    Sexually Abused: No   Family Status  Relation Name Status   Mother  Alive   MGM  (Not Specified)   Mat Aunt  (Not Specified)   PGF  (Not Specified)   Father  Deceased   Sister  (Not Specified)  No partnership data on file   Family History  Problem Relation Age of Onset   Diabetes Mother    BRCA 1/2 Mother    Diabetes Maternal Grandmother    Breast cancer Maternal Aunt    Colon cancer Paternal Grandfather    Breast cancer Sister    Allergies  Allergen Reactions   Strawberry Extract Rash     Medications: Outpatient Medications Prior to Visit  Medication Sig   nicotine polacrilex (COMMIT) 4 MG lozenge Take 4 mg by mouth as needed.   [DISCONTINUED] albuterol (VENTOLIN HFA) 108 (90 Base) MCG/ACT inhaler Inhale 2 puffs into the lungs every 4 (four) hours as needed for shortness of breath or wheezing.   [DISCONTINUED] hydroxychloroquine (PLAQUENIL) 200 MG tablet Take 200 mg by mouth 2 (two) times daily. Dr. Cherylann Dixon   [DISCONTINUED] LORazepam (ATIVAN) 0.5 MG tablet  Take 1 tablet by mouth 2 (two) times daily as needed.   No facility-administered medications prior to visit.    Review of Systems  Last CBC Lab Results  Component Value Date   WBC 6.9 10/20/2019   HGB 15.0 10/20/2019   HCT 44.4 10/20/2019   MCV 89.9 10/20/2019   MCH 30.4 10/20/2019   RDW 12.1 10/20/2019   PLT 311 10/20/2019   Last metabolic panel Lab Results  Component Value Date   GLUCOSE 102 (H) 10/20/2019   NA 138 10/20/2019   K 3.6 10/20/2019   CL 102  10/20/2019   CO2 25 10/20/2019   BUN 7 10/20/2019   CREATININE 0.79 10/20/2019   EGFR 91.0 07/08/2022   CALCIUM 9.3 10/20/2019   PROT 7.9 10/20/2019   ALBUMIN 4.4 10/20/2019   LABGLOB 2.3 03/29/2019   AGRATIO 2.1 03/29/2019   BILITOT 0.7 10/20/2019   ALKPHOS 46 10/20/2019   AST 19 10/20/2019   ALT 21 10/20/2019   ANIONGAP 11 10/20/2019   Last lipids Lab Results  Component Value Date   CHOL 177 03/29/2019   HDL 62 03/29/2019   LDLCALC 103 (H) 03/29/2019   TRIG 60 03/29/2019   CHOLHDL 2.9 03/29/2019   Last hemoglobin A1c No results found for: "HGBA1C" Last thyroid functions Lab Results  Component Value Date   TSH 1.370 03/29/2019   Last vitamin D No results found for: "25OHVITD2", "25OHVITD3", "VD25OH" Last vitamin B12 and Folate No results found for: "VITAMINB12", "FOLATE"     Objective    BP (!) 109/52   Pulse 85   Ht 5\' 4"  (1.626 m)   Wt 122 lb (55.3 kg)   SpO2 100%   BMI 20.94 kg/m  BP Readings from Last 3 Encounters:  03/14/23 (!) 109/52  01/12/23 124/64  10/20/19 (!) 105/57   Wt Readings from Last 3 Encounters:  03/14/23 122 lb (55.3 kg)  01/12/23 127 lb 6.4 oz (57.8 kg)  10/20/19 120 lb (54.4 kg)        Physical Exam Vitals reviewed. Exam conducted with a chaperone present.  Constitutional:      General: She is not in acute distress.    Appearance: Normal appearance. She is not ill-appearing, toxic-appearing or diaphoretic.  HENT:     Head: Normocephalic and atraumatic.      Right Ear: Tympanic membrane and external ear normal. There is no impacted cerumen.     Left Ear: Tympanic membrane and external ear normal. There is no impacted cerumen.     Nose: Nose normal.     Mouth/Throat:     Pharynx: Oropharynx is clear.  Eyes:     General: No scleral icterus.    Extraocular Movements: Extraocular movements intact.     Conjunctiva/sclera: Conjunctivae normal.     Pupils: Pupils are equal, round, and reactive to light.  Cardiovascular:     Rate and Rhythm: Normal rate and regular rhythm.     Pulses: Normal pulses.     Heart sounds: Normal heart sounds. No murmur heard.    No friction rub. No gallop.  Pulmonary:     Effort: Pulmonary effort is normal. No respiratory distress.     Breath sounds: Normal breath sounds. No wheezing, rhonchi or rales.  Abdominal:     General: Bowel sounds are normal. There is no distension.     Palpations: Abdomen is soft. There is no mass.     Tenderness: There is no abdominal tenderness. There is no guarding.  Genitourinary:    General: Normal vulva.     Pubic Area: No rash or pubic lice.      Labia:        Right: No rash, tenderness, lesion or injury.        Left: No rash, tenderness, lesion or injury.      Urethra: No urethral pain, urethral swelling or urethral lesion.     Vagina: No vaginal discharge, erythema, tenderness, bleeding or lesions.     Cervix: Normal.     Uterus: Normal.      Adnexa: Right adnexa normal and left adnexa normal.  Rectum: Normal.  Musculoskeletal:        General: No deformity.     Cervical back: Normal range of motion and neck supple.     Right lower leg: No edema.     Left lower leg: No edema.     Comments: Limited strength testing in left UE and RLE 2/2 to arthritis pain   Lymphadenopathy:     Cervical: No cervical adenopathy.  Skin:    General: Skin is warm.     Capillary Refill: Capillary refill takes less than 2 seconds.     Findings: No erythema or rash.  Neurological:      General: No focal deficit present.     Mental Status: She is alert and oriented to person, place, and time.     Cranial Nerves: Cranial nerves 2-12 are intact. No cranial nerve deficit or facial asymmetry.     Motor: Motor function is intact. No weakness.     Gait: Gait normal.  Psychiatric:        Mood and Affect: Mood normal.        Behavior: Behavior normal.       Last depression screening scores    03/14/2023    8:29 AM 01/12/2023    9:11 AM 03/29/2019    8:03 AM  PHQ 2/9 Scores  PHQ - 2 Score 0 0 0  PHQ- 9 Score 1 0 2    Last fall risk screening    01/12/2023    9:12 AM  Fall Risk   Falls in the past year? 0  Number falls in past yr: 0  Injury with Fall? 0    Last Audit-C alcohol use screening    03/14/2023    8:24 AM  Alcohol Use Disorder Test (AUDIT)  1. How often do you have a drink containing alcohol? 1  2. How many drinks containing alcohol do you have on a typical day when you are drinking? 0  3. How often do you have six or more drinks on one occasion? 0  AUDIT-C Score 1   A score of 3 or more in women, and 4 or more in men indicates increased risk for alcohol abuse, EXCEPT if all of the points are from question 1   No results found for any visits on 03/14/23.  Assessment & Plan    Routine Health Maintenance and Physical Exam   There is no immunization history on file for this patient.  Health Maintenance  Topic Date Due   Pneumococcal Vaccine 30-45 Years old (1 of 2 - PCV) Never done   HIV Screening  Never done   Hepatitis C Screening  Never done   DTaP/Tdap/Td (1 - Tdap) Never done   Cervical Cancer Screening (HPV/Pap Cotest)  03/22/2021   INFLUENZA VACCINE  Never done   COVID-19 Vaccine (1 - 2024-25 season) Never done   Colonoscopy  05/21/2024   HPV VACCINES  Aged Out    Problem List Items Addressed This Visit       Digestive   Irritable bowel syndrome with constipation   Relevant Medications   lubiprostone (AMITIZA) 8 MCG capsule   Other  Relevant Orders   CBC   CMP14+EGFR     Endocrine   Thyromegaly   Relevant Orders   TSH+T4F+T3Free     Musculoskeletal and Integument   Rheumatoid arthritis (HCC)   Relevant Medications   hydroxychloroquine (PLAQUENIL) 200 MG tablet     Other   Anxiety disorder   Relevant Medications  LORazepam (ATIVAN) 0.5 MG tablet   Other Relevant Orders   CMP14+EGFR   Annual physical exam - Primary   Chronic conditions are stable  Patient was counseled on benefits of regular physical activity with goal of 150 minutes of moderate to vigurous intensity 4 days per week  Patient was counseled to consume well balanced diet of fruits, vegetables, limited saturated fats and limited sugary foods and beverages with emphasis on consuming 6-8 glasses of water daily  Screening recommended today: A1c, lipids,CMP,CBC,Hep C,HIV  Colon cancer screening: 05/2014   Cervical CA screening: : recommended for updated pap, last NILM and negative HRHPV 03/2018   Mammogram due 10/2023, last 10/14/22     Vaccines recommended today: COVID,Tetanus booster, Pneumococcal        Other Visit Diagnoses       Screening for deficiency anemia         Screening for lipid disorders       Relevant Orders   Lipid panel     Screening for HIV (human immunodeficiency virus)       Relevant Orders   HIV Antibody (routine testing w rflx)     Encounter for hepatitis C screening test for low risk patient       Relevant Orders   Hepatitis C antibody     Screening for diabetes mellitus       Relevant Orders   Hemoglobin A1c     Screening for cervical cancer       Relevant Orders   Cytology - PAP             Rheumatoid Arthritis Rheumatoid arthritis requiring Plaquenil 200 mg twice daily. Not currently seeing a rheumatologist. Discussed the importance of medication adherence for symptom control. - Refill Plaquenil 200 mg twice daily  Asthma Requires a refill of albuterol for asthma management. Discussed the  importance of using albuterol as a rescue inhaler and monitoring for any increase in symptoms. - Refill albuterol  Irritable Bowel Syndrome with Constipation (IBS-C) Chronic constipation managed with Benefiber. Previous attempts to get Amitiza approved were unsuccessful. Will attempt to get prior authorization for Amitiza. Discussed the potential benefits of Amitiza for symptom relief and the need for prior authorization. Miralax and Senna were not helpful for relieving constipation symptoms in the past  - prescribe daily prior authorization for Amitiza  Thyromegaly Documented hx of Thyromegaly without significant enlargement on physical examination. Ordered thyroid function tests to establish a baseline. - Order TSH, T4, and T3  General Health Maintenance Discussed the importance of regular screenings and baseline lab work. Patient agreed to proceed with recommended tests. - Order CBC, A1c, CMP, lipid panel, HIV screen, hepatitis C screen - Perform Pap smear - Consider STI screening  Follow-up - Review lab results tomorrow - Review Pap smear results by Thursday or Friday.           No follow-ups on file.       Debra Ramp, MD  [facility] [phone] (phone) [phone] (fax)  [facility]

## 2023-03-14 NOTE — Telephone Encounter (Signed)
 Pharmacy Patient Advocate Encounter   Received notification from  [facility] that prior authorization for Lubiprostone capsules is required/requested.   Insurance verification completed.   The patient is insured through [facility] .   Per test claim: PA required; PA submitted to above mentioned insurance via CoverMyMeds Key/confirmation #/EOC [id number] Status is pending

## 2023-03-14 NOTE — Assessment & Plan Note (Signed)
 Reports increased stress and anxiety, particularly related to her husband's recent cancer diagnosis. Requires a refill of Ativan 0.5 mg twice daily as needed. Discussed the potential benefits and risks of continued Ativan use, including dependency and sedation. Chronic - Refill Ativan 0.5 mg twice daily as needed

## 2023-03-14 NOTE — Telephone Encounter (Signed)
 Please see the pt request below and advise

## 2023-03-14 NOTE — Assessment & Plan Note (Signed)
 Chronic conditions are stable  Patient was counseled on benefits of regular physical activity with goal of 150 minutes of moderate to vigurous intensity 4 days per week  Patient was counseled to consume well balanced diet of fruits, vegetables, limited saturated fats and limited sugary foods and beverages with emphasis on consuming 6-8 glasses of water daily  Screening recommended today: A1c, lipids,CMP,CBC,Hep C,HIV  Colon cancer screening: 05/2014   Cervical CA screening: : recommended for updated pap, last NILM and negative HRHPV 03/2018   Mammogram due 10/2023, last 10/14/22     Vaccines recommended today: COVID,Tetanus booster, Pneumococcal

## 2023-03-15 ENCOUNTER — Telehealth: Payer: Self-pay

## 2023-03-15 MED ORDER — NICOTINE POLACRILEX 4 MG MT LOZG: 4.0000 mg | LOZENGE | OROMUCOSAL | 11 refills | Status: AC | PRN

## 2023-03-15 NOTE — Telephone Encounter (Signed)
 Copied from CRM [id number]. Topic: Clinical - Prescription Issue >> Mar 15, 2023  9:15 AM Higinio Roger wrote: Reason for CRM: Maralyn Sago from [facility] stated there is one question that needs to be answered on the Prior Authorization Request.  Q: Has the pat demostrated a positive clinical response while using the requested medication?  Medication: lubiprostone (AMITIZA) 8 MCG capsule  Phone: [phone]; Option 3,4,2  Fax:[phone]  reference#: [id number]

## 2023-03-16 ENCOUNTER — Encounter: Payer: Self-pay | Admitting: Family Medicine

## 2023-03-16 LAB — CMP14+EGFR
ALT: 9 IU/L (ref 0–32)
AST: 15 IU/L (ref 0–40)
Albumin: 4.6 g/dL (ref 3.9–4.9)
Alkaline Phosphatase: 59 IU/L (ref 44–121)
BUN/Creatinine Ratio: 10 (ref 9–23)
BUN: 7 mg/dL (ref 6–24)
Bilirubin Total: 0.6 mg/dL (ref 0.0–1.2)
CO2: 17 mmol/L — ABNORMAL LOW (ref 20–29)
Calcium: 9.2 mg/dL (ref 8.7–10.2)
Chloride: 102 mmol/L (ref 96–106)
Creatinine, Ser: 0.72 mg/dL (ref 0.57–1.00)
Globulin, Total: 3 g/dL (ref 1.5–4.5)
Glucose: 92 mg/dL (ref 70–99)
Potassium: 4.3 mmol/L (ref 3.5–5.2)
Sodium: 139 mmol/L (ref 134–144)
Total Protein: 7.6 g/dL (ref 6.0–8.5)
eGFR: 102 mL/min/{1.73_m2} (ref >59)

## 2023-03-16 LAB — TSH+T4F+T3FREE
Free T4: 1.53 ng/dL (ref 0.82–1.77)
T3, Free: 2.7 pg/mL (ref 2.0–4.4)
TSH: 1.86 u[IU]/mL (ref 0.450–4.500)

## 2023-03-16 LAB — HEMOGLOBIN A1C
Est. average glucose Bld gHb Est-mCnc: 103 mg/dL
Hgb A1c MFr Bld: 5.2 % (ref 4.8–5.6)

## 2023-03-16 LAB — CBC
Hematocrit: 40 % (ref 34.0–46.6)
Hemoglobin: 13.4 g/dL (ref 11.1–15.9)
MCH: 30.1 pg (ref 26.6–33.0)
MCHC: 33.5 g/dL (ref 31.5–35.7)
MCV: 90 fL (ref 79–97)
Platelets: 172 10*3/uL (ref 150–450)
RBC: 4.45 x10E6/uL (ref 3.77–5.28)
RDW: 12.9 % (ref 11.7–15.4)
WBC: 8.7 10*3/uL (ref 3.4–10.8)

## 2023-03-16 LAB — HIV ANTIBODY (ROUTINE TESTING W REFLEX): HIV Screen 4th Generation wRfx: NONREACTIVE

## 2023-03-16 LAB — LIPID PANEL
Chol/HDL Ratio: 2.8 ratio (ref 0.0–4.4)
Cholesterol, Total: 175 mg/dL (ref 100–199)
HDL: 62 mg/dL (ref >39)
LDL Chol Calc (NIH): 101 mg/dL — ABNORMAL HIGH (ref 0–99)
Triglycerides: 63 mg/dL (ref 0–149)
VLDL Cholesterol Cal: 12 mg/dL (ref 5–40)

## 2023-03-16 LAB — HEPATITIS C ANTIBODY

## 2023-03-17 LAB — CYTOLOGY - PAP
Comment: NEGATIVE
Diagnosis: NEGATIVE
High risk HPV: NEGATIVE

## 2023-03-17 NOTE — Telephone Encounter (Signed)
FYI, please see the message below.

## 2023-03-17 NOTE — Telephone Encounter (Signed)
 Please see the pt denial for Lubiprostone 

## 2023-03-17 NOTE — Telephone Encounter (Signed)
 Response sent via faxed document. Can we please appeal since this agent has help symptoms previously

## 2023-03-17 NOTE — Telephone Encounter (Signed)
 Pharmacy Patient Advocate Encounter  Received notification from [facility] that Prior Authorization for Lubiprostone capsules has been DENIED.  See denial reason below. No denial letter attached in CMM. Will attach denial letter to Media tab once received.   PA #/Case ID/Reference #: [id number]

## 2023-03-20 NOTE — Telephone Encounter (Signed)
FYI please see the message below.

## 2023-09-08 ENCOUNTER — Other Ambulatory Visit: Payer: Self-pay | Admitting: Family Medicine

## 2023-10-24 ENCOUNTER — Other Ambulatory Visit: Payer: Self-pay | Admitting: Family Medicine

## 2023-10-24 DIAGNOSIS — F410 Panic disorder [episodic paroxysmal anxiety] without agoraphobia: Secondary | ICD-10-CM

## 2023-12-10 ENCOUNTER — Other Ambulatory Visit: Payer: Self-pay | Admitting: Family Medicine

## 2023-12-11 ENCOUNTER — Encounter: Payer: Self-pay | Admitting: Family Medicine

## 2023-12-12 MED ORDER — HYDROXYCHLOROQUINE SULFATE 200 MG PO TABS: 200.0000 mg | ORAL_TABLET | Freq: Two times a day (BID) | ORAL | 0 refills | Status: AC

## 2024-01-22 ENCOUNTER — Encounter: Payer: Self-pay | Admitting: Family Medicine

## 2024-01-22 DIAGNOSIS — M0579 Rheumatoid arthritis with rheumatoid factor of multiple sites without organ or systems involvement: Secondary | ICD-10-CM

## 2024-01-22 DIAGNOSIS — M05741 Rheumatoid arthritis with rheumatoid factor of right hand without organ or systems involvement: Secondary | ICD-10-CM

## 2024-03-14 ENCOUNTER — Encounter: Admitting: Family Medicine
